# Patient Record
Sex: Female | Born: 1975 | Race: White | Hispanic: No | Marital: Married | Smoking: Never smoker
Health system: Southern US, Community
[De-identification: ages and names within clinical notes are randomized; demographics above are authoritative.]

## PROBLEM LIST (undated history)

## (undated) DIAGNOSIS — F410 Panic disorder [episodic paroxysmal anxiety] without agoraphobia: Secondary | ICD-10-CM

## (undated) HISTORY — PX: APPENDECTOMY: SHX54

## (undated) HISTORY — DX: Panic disorder (episodic paroxysmal anxiety): F41.0

---

## 2004-07-31 ENCOUNTER — Inpatient Hospital Stay: Payer: Self-pay

## 2004-11-09 ENCOUNTER — Ambulatory Visit: Payer: Self-pay | Admitting: Obstetrics and Gynecology

## 2005-04-17 ENCOUNTER — Ambulatory Visit: Payer: Self-pay | Admitting: Family Medicine

## 2006-03-14 ENCOUNTER — Ambulatory Visit: Payer: Self-pay | Admitting: Internal Medicine

## 2006-03-28 ENCOUNTER — Inpatient Hospital Stay: Payer: Self-pay | Admitting: Surgery

## 2007-05-02 IMAGING — CT CT CHEST W/ CM
1 series · 16 of 32 positions shown, 20 images · non-contrast
Comparison: none

REASON FOR EXAM: Abnormal chest xray showed left hilar mass
COMMENTS:

[Series 2: soft tissue · axial · 0.62mm/px · z∈[-342,-42]mm · 16 of 68 slices shown, 20 images]
[im 5/68  soft-tissue]
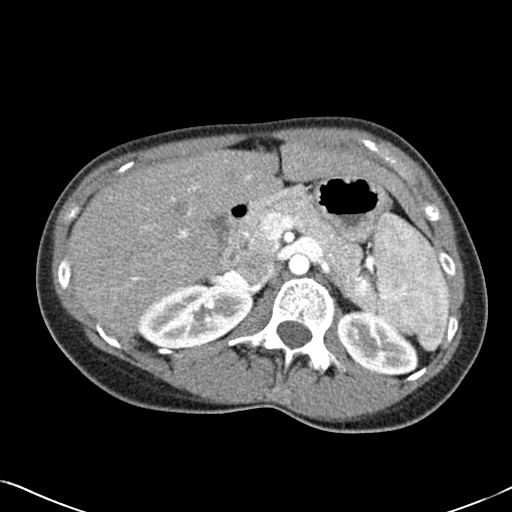
[im 5/68  bone]
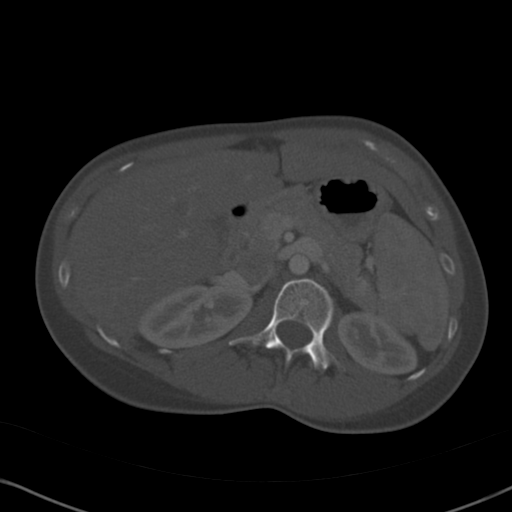
[im 9/68  soft-tissue]
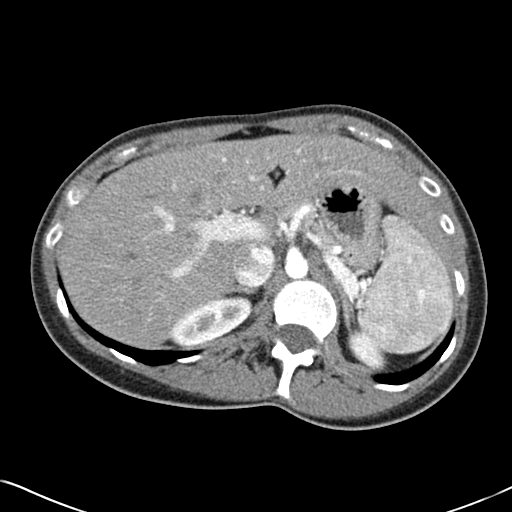
[im 13/68  soft-tissue]
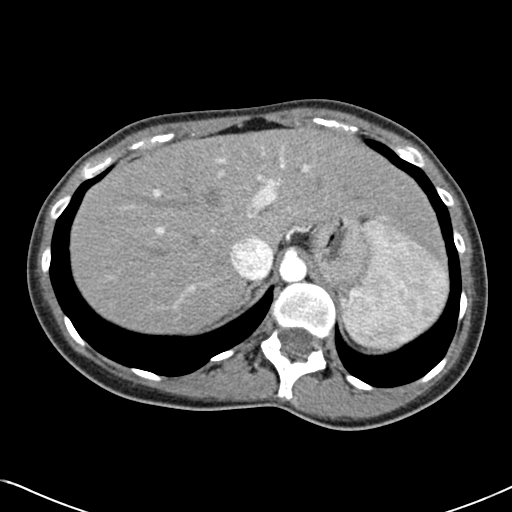
[im 18/68  soft-tissue]
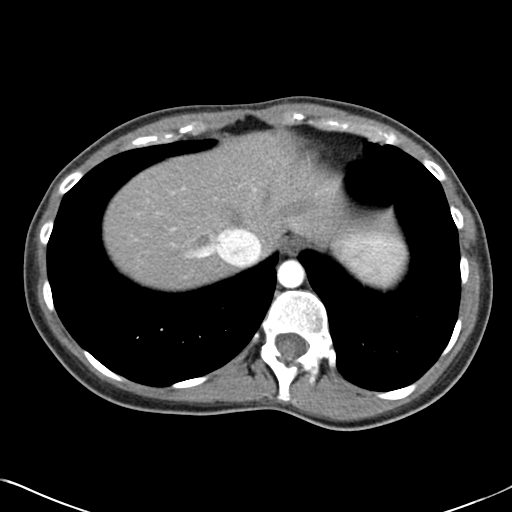
[im 22/68  soft-tissue]
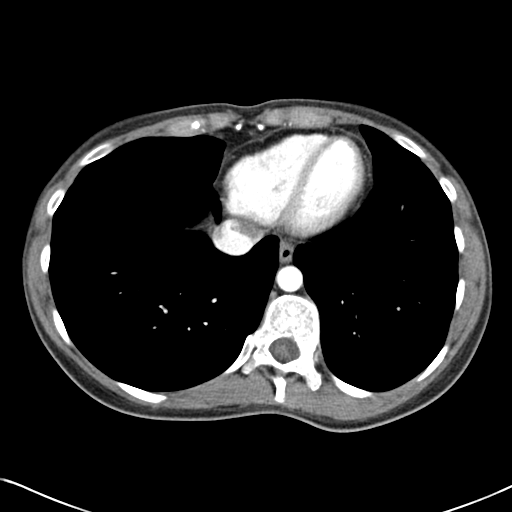
[im 26/68  soft-tissue]
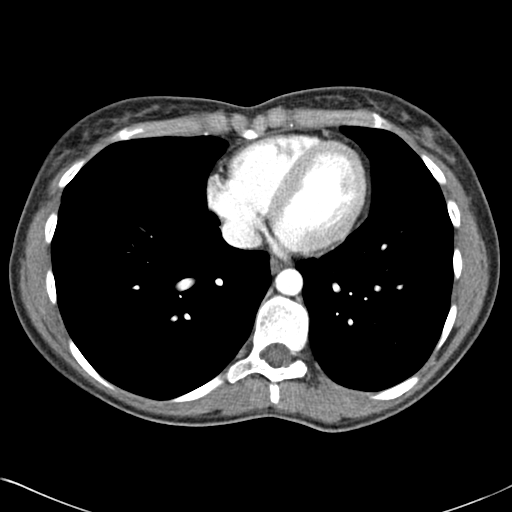
[im 31/68  soft-tissue]
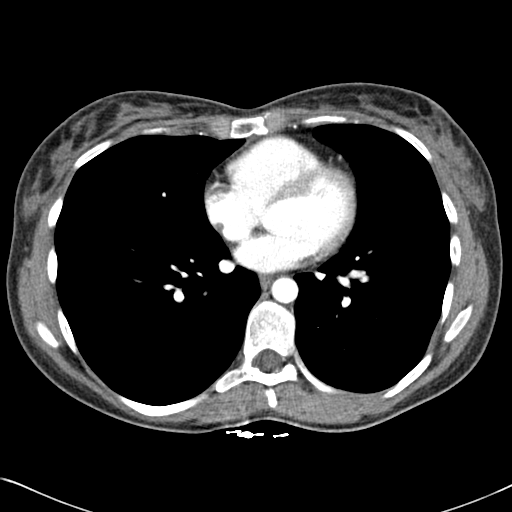
[im 37/68  soft-tissue]
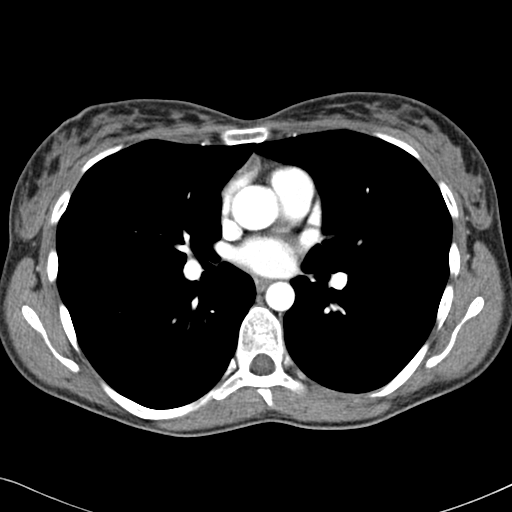
[im 42/68  soft-tissue]
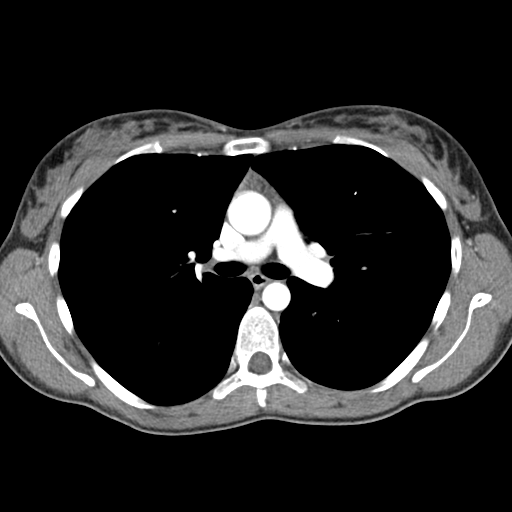
[im 42/68  bone]
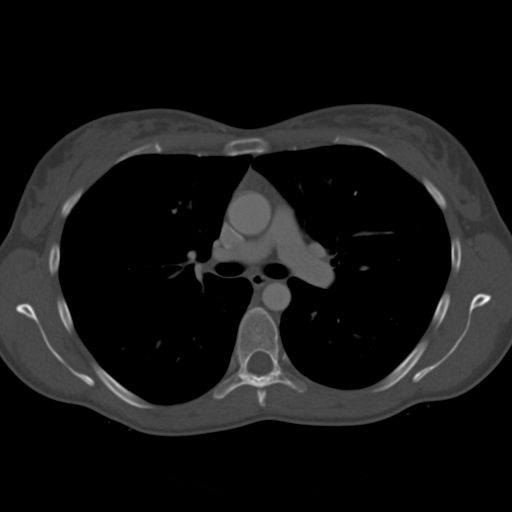
[im 46/68  soft-tissue]
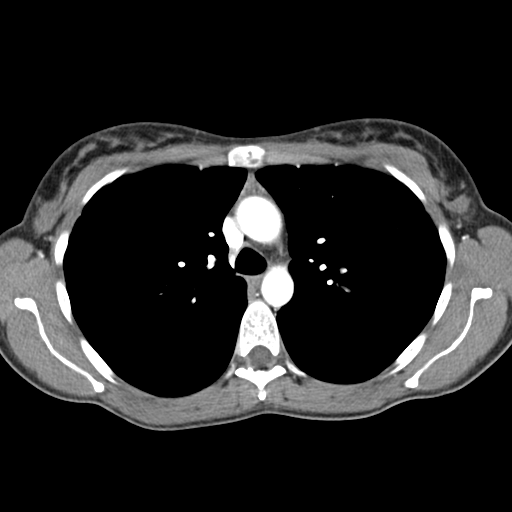
[im 50/68  soft-tissue]
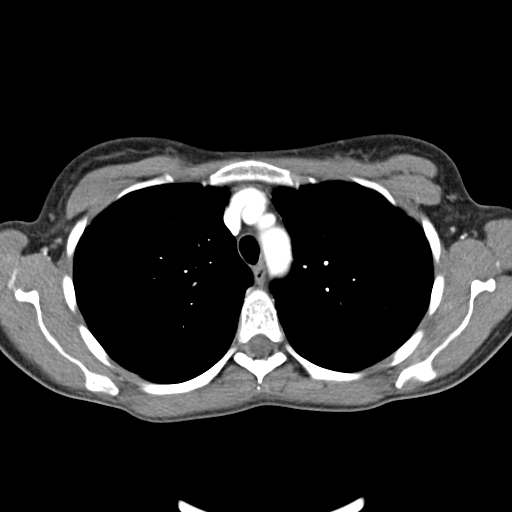
[im 55/68  soft-tissue]
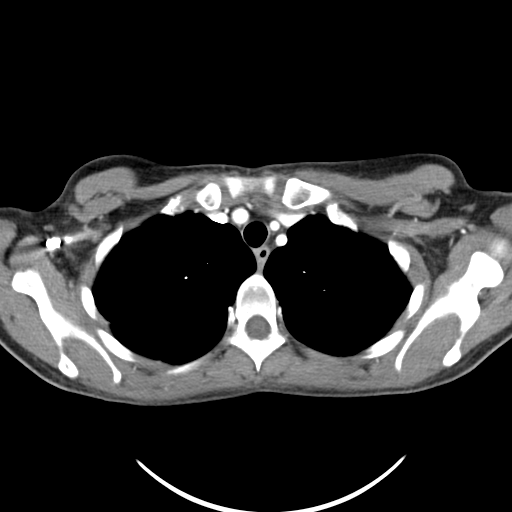
[im 59/68  soft-tissue]
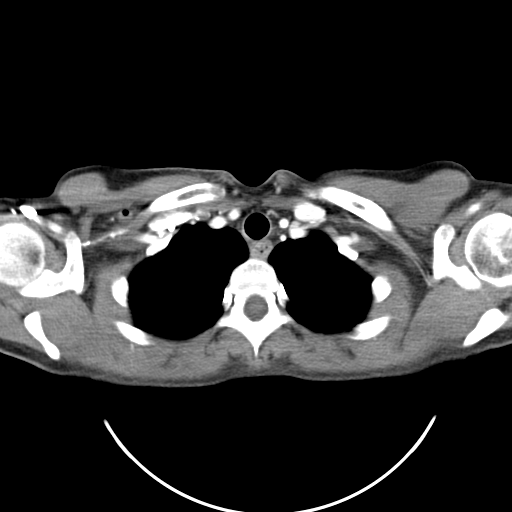
[im 59/68  lung]
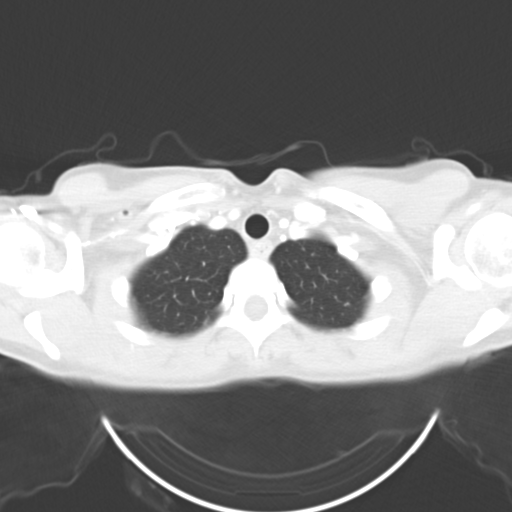
[im 61/68  lung]
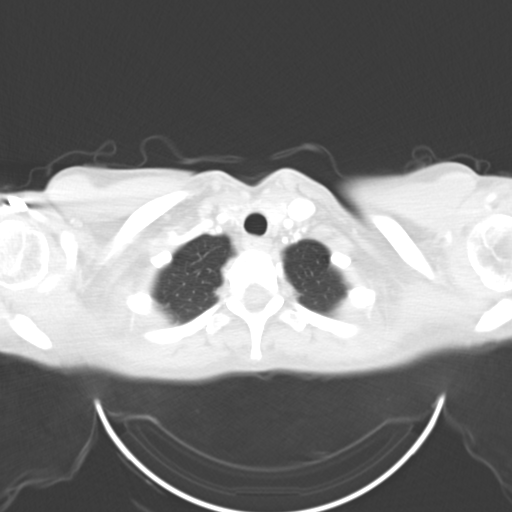
[im 63/68  soft-tissue]
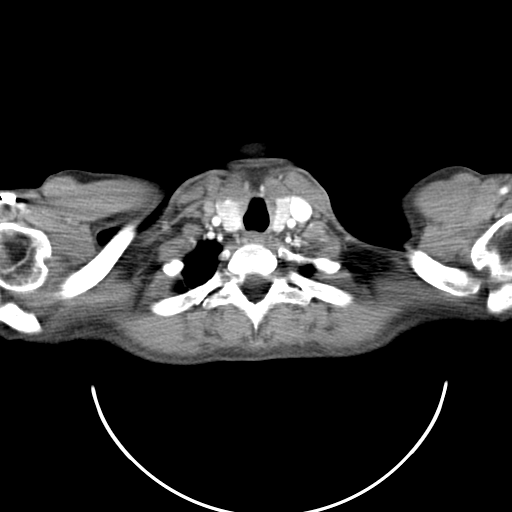
[im 63/68  lung]
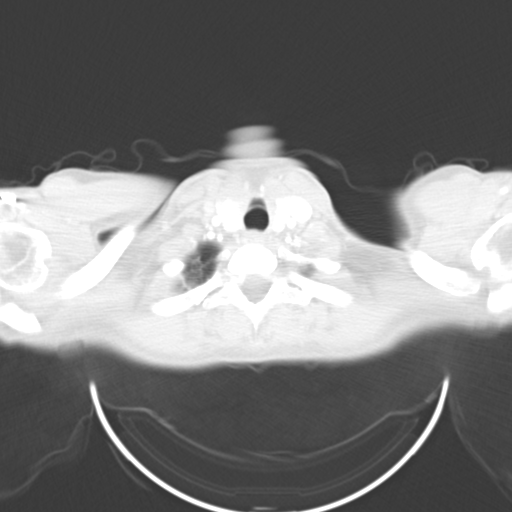
[im 65/68  lung]
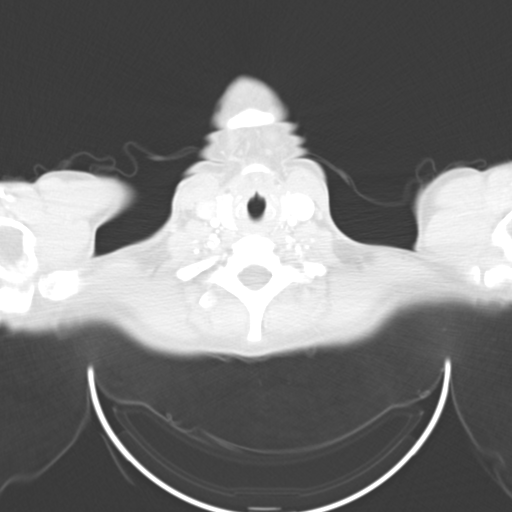

[16 of 32 positions shown; findings below may reference images not displayed]

PROCEDURE:     CT  - CT CHEST WITH CONTRAST  - March 14, 2006  [DATE]

RESULT:          Spiral 5 mm sections were obtained from the thoracic inlet
through the lung bases status post intravenous administration of 75 ml of
[id number].

Evaluation of the mediastinum and hilar regions and structures demonstrates
no evidence of mediastinal nor hilar adenopathy nor masses.  The region of
concern on the previous chest radiograph appears to represent a vascular
structure.  The lung parenchyma demonstrate no evidence of focal
infiltrates, effusions, edema, masses nor nodules.  The visualized upper
abdominal viscera demonstrate no gross abnormalities.
IMPRESSION: 1.     There is no evidence of mediastinal nor hilar adenopathy nor masses.
2.     The lung parenchyma demonstrate no evidence of focal abnormalities.

## 2010-11-08 ENCOUNTER — Ambulatory Visit: Payer: Self-pay | Admitting: Family Medicine

## 2011-12-27 IMAGING — MG MM DIGITAL SCREENING BILAT W/ CAD
1 series · 5 of 5 positions shown · non-contrast
Comparison: none

REASON FOR EXAM: SCR BASELINE
COMMENTS:

[Series 7469: R CC · right · 5 of 5 slices shown]
[im 1/5]
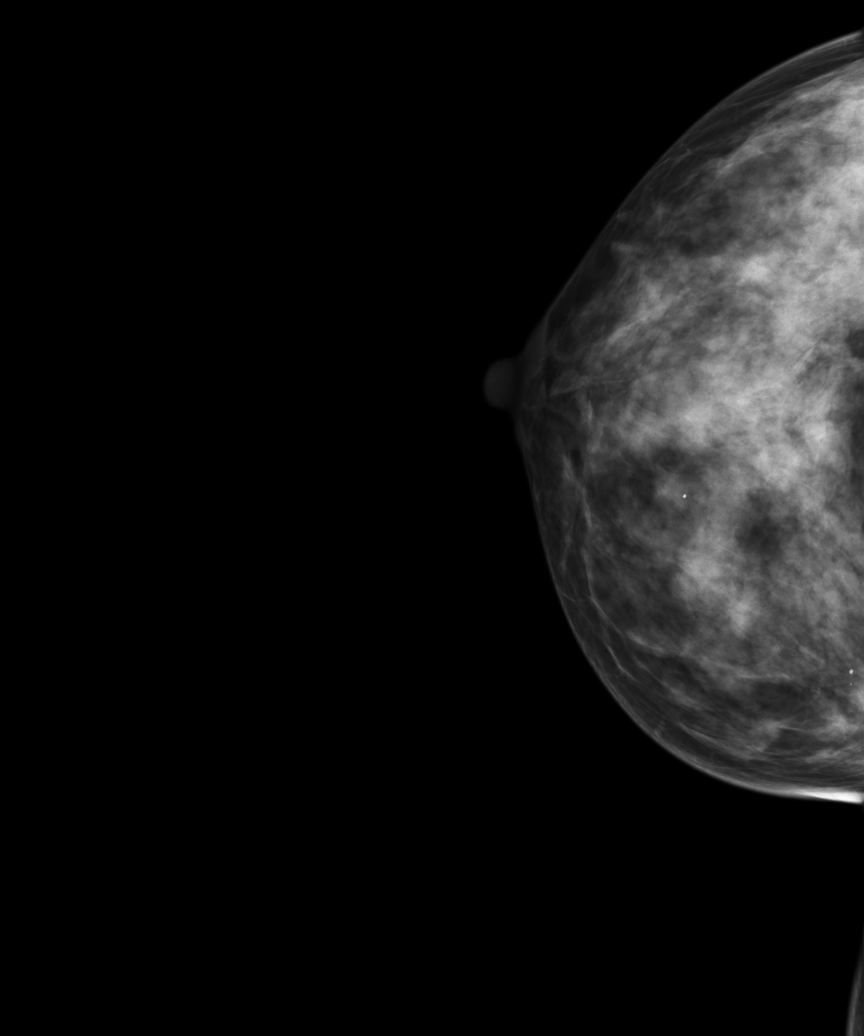
[im 2/5]
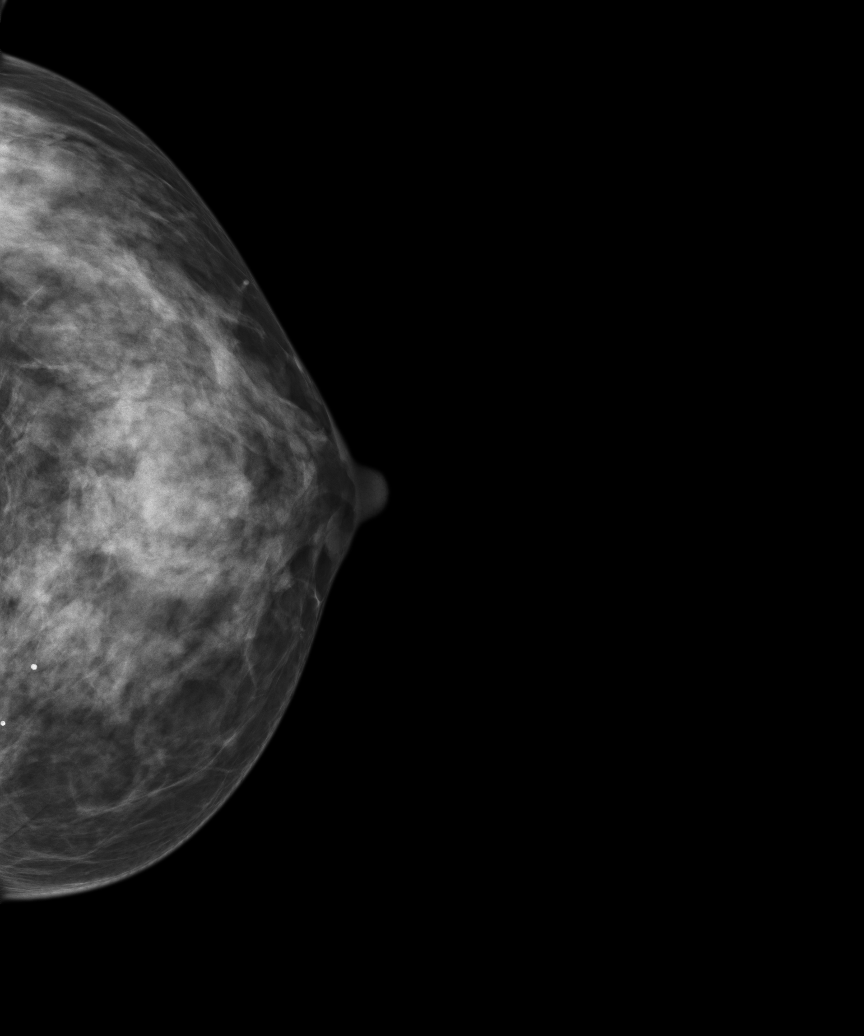
[im 3/5]
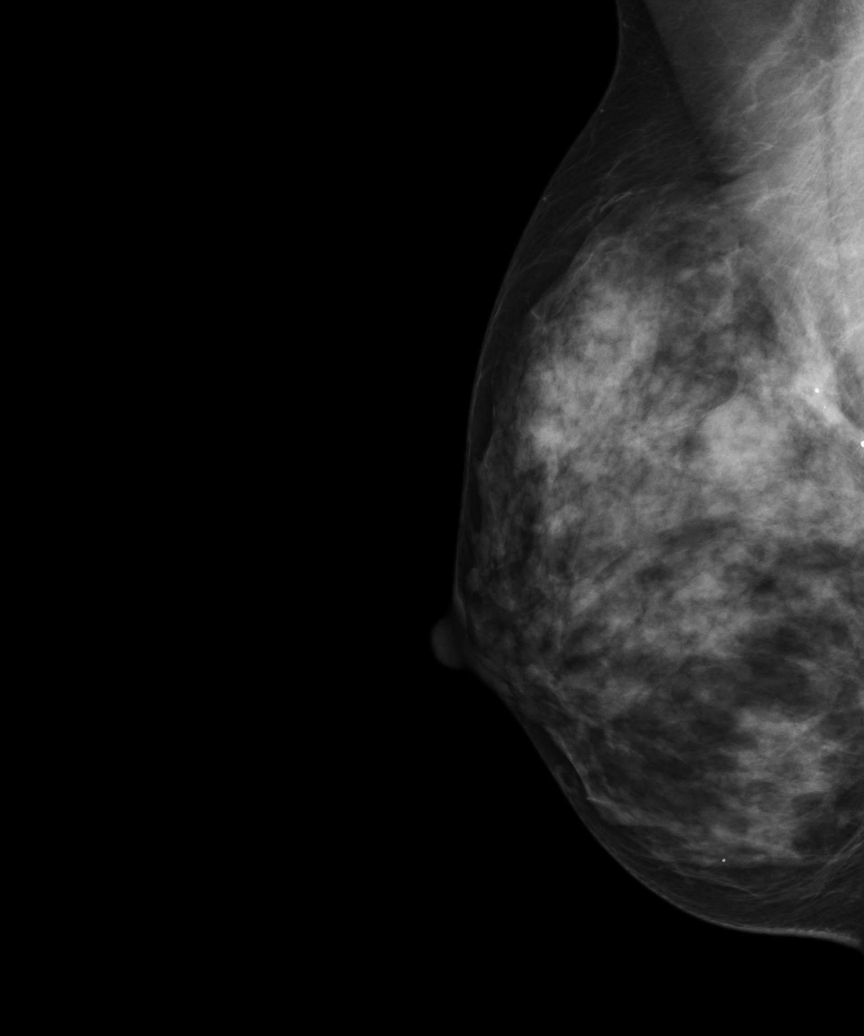
[im 4/5]
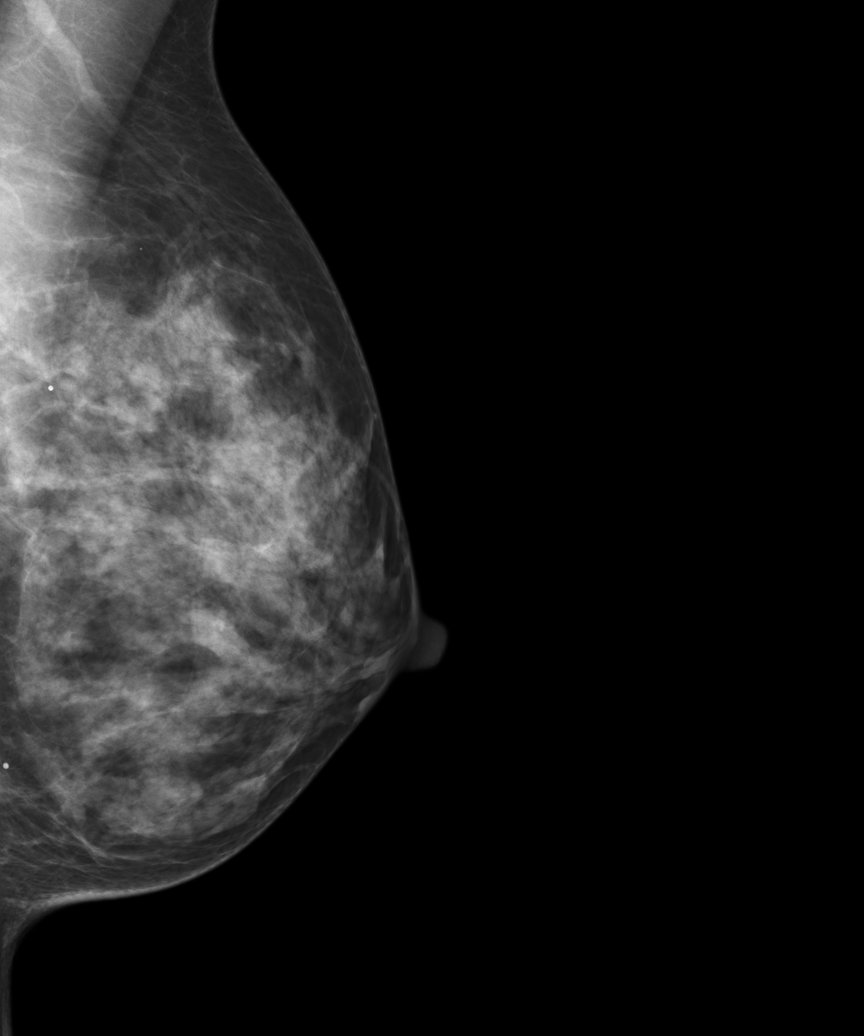
[im 5/5]
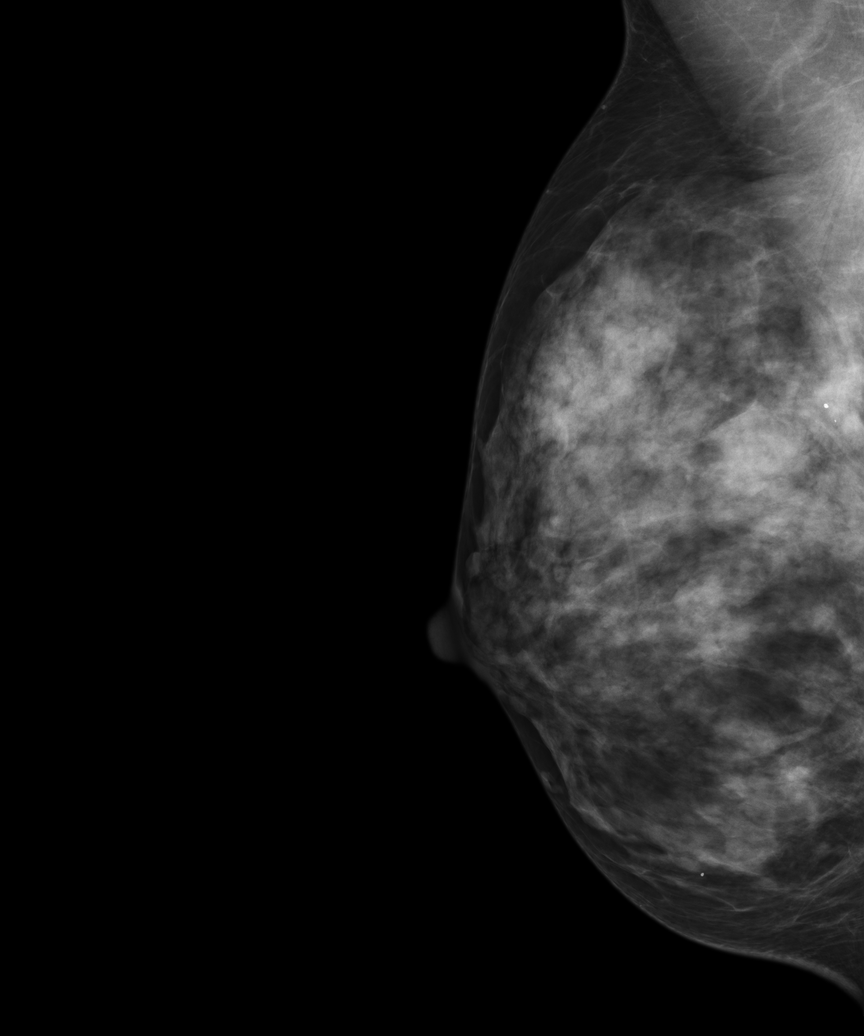

[5 of 5 positions shown; findings below may reference images not displayed]

PROCEDURE:     MMM - MMM DGTL SCREENING MAMMO W/CAD  - November 08, 2010  [DATE]

RESULT:     There are no prior mammograms for comparison. The breast
parenchyma is bilaterally dense which lowers the sensitivity of mammography.
No mass or malignant appearing microcalcifications are seen. A few scattered
benign-appearing calcifications are noted bilaterally.
IMPRESSION: 1. Bilaterally benign appearing baseline mammogram.

BI-RADS: Category 1 - Negative

A NEGATIVE MAMMOGRAM REPORT DOES NOT PRECLUDE BIOPSY OR OTHER EVALUATION OF
A CLINICALLY PALPABLE OR OTHERWISE SUSPICIOUS MASS OR LESION. BREAST CANCER
MAY NOT BE DETECTED BY MAMMOGRAPHY IN UP TO 10% OF CASES.

## 2013-08-24 ENCOUNTER — Ambulatory Visit: Payer: Self-pay | Admitting: Obstetrics and Gynecology

## 2015-10-05 ENCOUNTER — Other Ambulatory Visit: Payer: Self-pay | Admitting: Obstetrics and Gynecology

## 2015-10-05 DIAGNOSIS — Z1231 Encounter for screening mammogram for malignant neoplasm of breast: Secondary | ICD-10-CM

## 2015-10-06 ENCOUNTER — Ambulatory Visit
Admission: RE | Admit: 2015-10-06 | Discharge: 2015-10-06 | Disposition: A | Discharge status: Home / Self Care | Payer: 59 | Source: Ambulatory Visit | Attending: Obstetrics and Gynecology | Admitting: Obstetrics and Gynecology

## 2015-10-06 DIAGNOSIS — Z1231 Encounter for screening mammogram for malignant neoplasm of breast: Secondary | ICD-10-CM | POA: Diagnosis not present

## 2015-11-08 LAB — HM MAMMOGRAPHY: HM MAMMO: NORMAL (ref 0–4)

## 2016-10-09 ENCOUNTER — Other Ambulatory Visit: Payer: Self-pay | Admitting: Family Medicine

## 2016-10-09 DIAGNOSIS — Z1231 Encounter for screening mammogram for malignant neoplasm of breast: Secondary | ICD-10-CM

## 2016-10-31 ENCOUNTER — Ambulatory Visit
Admission: RE | Admit: 2016-10-31 | Discharge: 2016-10-31 | Disposition: A | Discharge status: Home / Self Care | Payer: 59 | Source: Ambulatory Visit | Attending: Family Medicine | Admitting: Family Medicine

## 2016-10-31 DIAGNOSIS — Z1231 Encounter for screening mammogram for malignant neoplasm of breast: Secondary | ICD-10-CM | POA: Diagnosis not present

## 2016-11-18 ENCOUNTER — Encounter: Payer: Self-pay | Admitting: *Deleted

## 2016-11-18 ENCOUNTER — Encounter: Payer: Self-pay | Admitting: Family Medicine

## 2016-11-18 DIAGNOSIS — F410 Panic disorder [episodic paroxysmal anxiety] without agoraphobia: Secondary | ICD-10-CM

## 2016-11-18 DIAGNOSIS — F064 Anxiety disorder due to known physiological condition: Secondary | ICD-10-CM | POA: Insufficient documentation

## 2016-11-19 ENCOUNTER — Ambulatory Visit (INDEPENDENT_AMBULATORY_CARE_PROVIDER_SITE_OTHER): Payer: 59 | Admitting: Family Medicine

## 2016-11-19 ENCOUNTER — Encounter: Payer: Self-pay | Admitting: Family Medicine

## 2016-11-19 VITALS — BP 106/75 | HR 70 | Temp 98.4°F | Resp 16 | Ht 61.0 in | Wt 131.0 lb

## 2016-11-19 DIAGNOSIS — J0100 Acute maxillary sinusitis, unspecified: Secondary | ICD-10-CM

## 2016-11-19 MED ORDER — PSEUDOEPHEDRINE-GUAIFENESIN ER 60-600 MG PO TB12: 1.0000 | ORAL_TABLET | Freq: Two times a day (BID) | ORAL | 0 refills | Status: DC

## 2016-11-19 MED ORDER — AMOXICILLIN-POT CLAVULANATE 875-125 MG PO TABS: 1.0000 | ORAL_TABLET | Freq: Two times a day (BID) | ORAL | 0 refills | Status: DC

## 2016-11-19 NOTE — Progress Notes (Signed)
  Name: Sheila Clarke   MRN: [id number]    DOB: 10-01-1976   Date:11/19/2016       Progress Note  Subjective  Chief Complaint  Chief Complaint  Patient presents with  . Sinusitis    HPI Here c/o sinus pain and pressure.  Started with nasal congestion and sneezing.  Has had thick yellow mucus and bloody mucus. Maxicillary pressure.  No cough.  No fever.  Takes Benadryl at night.  Takes Celexa for chronic anxiety disorder.  No problem-specific Assessment & Plan notes found for this encounter.   Past Medical History:  Diagnosis Date  . Panic attacks     Social History  Substance Use Topics  . Smoking status: Never Smoker  . Smokeless tobacco: Never Used  . Alcohol use No     Current Outpatient Prescriptions:  .  citalopram (CELEXA) 20 MG tablet, Take 20 mg by mouth daily., Disp: , Rfl:   Not on File  Review of Systems  Constitutional: Negative for chills, fever, malaise/fatigue and weight loss.  HENT: Positive for congestion and sinus pain. Negative for hearing loss, sore throat and tinnitus.   Eyes: Negative for blurred vision and double vision.  Respiratory: Negative for cough, sputum production and shortness of breath.   Cardiovascular: Negative for chest pain, palpitations and leg swelling.  Gastrointestinal: Negative for abdominal pain, blood in stool and heartburn.  Genitourinary: Negative for dysuria, frequency and urgency.  Musculoskeletal: Negative for back pain and myalgias.  Skin: Negative for rash.  Neurological: Negative for dizziness, tingling, tremors, weakness and headaches.  Psychiatric/Behavioral: Negative for depression. The patient is not nervous/anxious and does not have insomnia.       Objective  Vitals:   11/19/16 1110  BP: 106/75  Pulse: 70  Resp: 16  Temp: 98.4 F (36.9 C)  TempSrc: Oral  SpO2: 100%  Weight: 131 lb (59.4 kg)  Height: 5' 1"  (1.549 m)     Physical Exam  Constitutional: She is oriented to person, place, and  time and well-developed, well-nourished, and in no distress. No distress.  HENT:  Head: Normocephalic and atraumatic.  Right Ear: External ear normal.  Left Ear: External ear normal.  Nose: Mucosal edema and rhinorrhea present. Right sinus exhibits maxillary sinus tenderness. Left sinus exhibits maxillary sinus tenderness.  Neck: Normal range of motion. Neck supple. No thyromegaly present.  Cardiovascular: Normal rate, regular rhythm and normal heart sounds.   Pulmonary/Chest: No respiratory distress. She has no wheezes. She has no rales.  Lymphadenopathy:    She has no cervical adenopathy.  Neurological: She is alert and oriented to person, place, and time.  Vitals reviewed.     No results found for this or any previous visit (from the past 2160 hour(s)).   Assessment & Plan  1. Acute non-recurrent maxillary sinusitis  - amoxicillin-clavulanate (AUGMENTIN) 875-125 MG tablet; Take 1 tablet by mouth 2 (two) times daily.  Dispense: 20 tablet; Refill: 0 - pseudoephedrine-guaifenesin (MUCINEX D) 60-600 MG 12 hr tablet; Take 1 tablet by mouth every 12 (twelve) hours.  Dispense: 20 tablet; Refill: 0

## 2016-12-25 ENCOUNTER — Encounter: Payer: Self-pay | Admitting: Family Medicine

## 2016-12-25 ENCOUNTER — Ambulatory Visit (INDEPENDENT_AMBULATORY_CARE_PROVIDER_SITE_OTHER): Payer: 59 | Admitting: Family Medicine

## 2016-12-25 VITALS — BP 105/73 | HR 68 | Temp 97.4°F | Resp 16 | Ht 61.0 in | Wt 131.0 lb

## 2016-12-25 DIAGNOSIS — Z85828 Personal history of other malignant neoplasm of skin: Secondary | ICD-10-CM | POA: Diagnosis not present

## 2016-12-25 DIAGNOSIS — F064 Anxiety disorder due to known physiological condition: Secondary | ICD-10-CM | POA: Diagnosis not present

## 2016-12-25 DIAGNOSIS — F410 Panic disorder [episodic paroxysmal anxiety] without agoraphobia: Secondary | ICD-10-CM

## 2016-12-25 DIAGNOSIS — L989 Disorder of the skin and subcutaneous tissue, unspecified: Secondary | ICD-10-CM | POA: Diagnosis not present

## 2016-12-25 DIAGNOSIS — Z7689 Persons encountering health services in other specified circumstances: Secondary | ICD-10-CM | POA: Diagnosis not present

## 2016-12-25 MED ORDER — CITALOPRAM HYDROBROMIDE 20 MG PO TABS: 10.0000 mg | ORAL_TABLET | Freq: Every day | ORAL | 11 refills | Status: DC

## 2016-12-25 NOTE — Progress Notes (Signed)
Subjective:    Patient ID: Sheila Clarke, female    DOB: 28-May-1976, 41 y.o.   MRN: [id number]  Sheila Clarke is a 41 y.o. female presenting on 12/25/2016 for Establish Care (pt is concerned about spot in her shoulder could be skin cancer )  Previously had been followed by PCP at [place] (previously with Dr Gaylan Gerold, then he retired, and switched PCP), now lives closer to [place], establishing here.  HPI   Left Shoulder, Abnormal Skin Lesion - Reports new finding of abnormal skin lesion on back of Left shoulder, had spot that she had scratched about 2-3 weeks ago, initially had some scabbing. Some symptoms of itch initially. Not painful. No redness. - Prior history of abnormal skin lesions, removed one on and other under her R breast, diagnosed with skin cancer, unsure exact type, thinks it may be Basal Cell Carcinoma - History of prior increased sun exposure, and sun burns previously would have sunburn easily in past. Now more cautious.  Anxiety, Chronic with Panic Attacks: - Chronic problem since about age 76, with anxiety diagnosis, has been on variety of medications in past had been on Klonopin, and possibly Wellbutrin. Thought that she did not do as well on other medication felt "foggy" on klonopin, in past she had come off of it during pregnancy in past, without good results. - Currently taking Celexa 44m tabs - usually takes 18mdaily (half tab), for past 10 years, doing well on this dose, only occasionally needs to take whole tab 2057mor few days at a time with inc stress such as holidays - Describes her anxiety, is well controlled. But does have history of panic attacks, often trigger is related to being overwhelmed or stress. Has had panic since young age, symptoms with brief spell of palpitations and rapid heart beat (without chest pain, pressure, dyspnea, sweating), often self limited. She is more aware now how to cope with and handle panic attacks, and  often improved with deep breathing. Now infrequent flares. - Prior history of seeing Psychiatry vs Psychology / had some therapy as child but nothing that has been continued - Denies any depression, sadness, insomnia  Recent Sinusitis - resolved on Augmentin  [facility] 12/25[facility]2016 11/19/2016  Decreased Interest 0 0  Down, Depressed, Hopeless 0 0  PHQ - 2 Score 0 0   GAD 7 : Generalized Anxiety Score 12/25/2016  Nervous, Anxious, on Edge 0  Control/stop worrying 0  Worry too much - different things 0  Trouble relaxing 0  Restless 0  Easily annoyed or irritable 0  Afraid - awful might happen 0  Total GAD 7 Score 0  Anxiety Difficulty Not difficult at all   Past Medical History:  Diagnosis Date  . Panic attacks    Past Surgical History:  Procedure Laterality Date  . APPENDECTOMY     Social History   Social History  . Marital status: Married    Spouse name: N/A  . Number of children: N/A  . Years of education: N/A   Occupational History  . Not on file.   Social History Main Topics  . Smoking status: Never Smoker  . Smokeless tobacco: Never Used  . Alcohol use No  . Drug use: No  . Sexual activity: Yes   Other Topics Concern  . Not on file   Social History Narrative  . No narrative on file   Family History  Problem Relation Age of Onset  . Congestive  Heart Failure Mother   . Asthma Mother   . Cancer Father   . Heart disease Father   . Skin cancer Father   . Breast cancer Neg Hx    No current outpatient prescriptions on file prior to visit.   No current facility-administered medications on file prior to visit.     Review of Systems  Constitutional: Negative for activity change, appetite change, chills, diaphoresis, fatigue, fever and unexpected weight change.  HENT: Negative for congestion, hearing loss and sinus pressure.   Eyes: Negative for visual disturbance.  Respiratory: Negative for cough, chest tightness, shortness of breath and  wheezing.   Cardiovascular: Negative for chest pain, palpitations and leg swelling.  Gastrointestinal: Negative for abdominal pain, constipation, diarrhea, nausea and vomiting.  Endocrine: Negative for cold intolerance and polyuria.  Genitourinary: Negative for dysuria, frequency and hematuria.  Musculoskeletal: Negative for arthralgias and neck pain.  Skin: Negative for rash.  Allergic/Immunologic: Negative for environmental allergies.  Neurological: Negative for dizziness, weakness, light-headedness, numbness and headaches.  Hematological: Negative for adenopathy.  Psychiatric/Behavioral: Negative for behavioral problems, decreased concentration, dysphoric mood and sleep disturbance. The patient is nervous/anxious (occasional panic, but otherwise controlled).    Per HPI unless specifically indicated above     Objective:    BP 105/73   Pulse 68   Temp 97.4 F (36.3 C) (Oral)   Resp 16   Ht 5' 1"  (1.549 m)   Wt 131 lb (59.4 kg)   BMI 24.75 kg/m   Wt Readings from Last 3 Encounters:  12/25/16 131 lb (59.4 kg)  11/19/16 131 lb (59.4 kg)    Physical Exam  Constitutional: She appears well-developed and well-nourished. No distress.  Well-appearing, comfortable, cooperative  HENT:  Head: Normocephalic and atraumatic.  Mouth/Throat: Oropharynx is clear and moist.  Eyes: Conjunctivae are normal.  Neck: Normal range of motion. Neck supple.  Cardiovascular: Normal rate.   Pulmonary/Chest: Effort normal.  Musculoskeletal: She exhibits no edema.  Neurological: She is alert.  Skin: Skin is warm and dry. No rash noted. She is not diaphoretic. No erythema.  Left posterior shoulder < 1 cm abnormal skin lesion with rough palpable area, without redness, some darker pigmentation compared to surrounding freckled skin, but not entirely consistent with defined nevus. No ulceration or bleeding. Non tender. No surrounding abnormality  Psychiatric: She has a normal mood and affect. Her behavior  is normal.  Well groomed, good eye contact, normal speech and thoughts  Nursing note and vitals reviewed.  Left Shoulder Posterior       Assessment & Plan:   Problem List Items Addressed This Visit    Skin lesion of back    Concern with abnormal distinct rough non healing patch on left shoulder for few weeks now, minimal symptoms, does seem slightly discolored or demarcated compared to surrounding skin. Otherwise not entirely consistent with a nevus, difficult with her base skin with freckles - Personal history of skin cancer reported (not melanoma) Family history of skin cancer father.  Plan: 1. Referral to [facility] Dermatology for evaluation and management of abnormal lesion - patient preference, she had previously been seen at [facility] and would like to try alternative Dermatology, may benefit from future routine skin checks for monitoring      Relevant Orders   Ambulatory referral to Dermatology   History of skin cancer    Reported 2 abnormal skin lesions, biopsy/removed from R breast / trunk - diagnosed with skin cancer, unsure what type (approx  2013) - does not think it was melanoma - Concern now with abnormal skin lesion, and risk factors of fair skin with prior high sun exposure - Referral to Dermatology      Relevant Orders   Ambulatory referral to Dermatology   Anxiety disorder due to general medical condition with panic attack    Stable, chronic anxiety >20 years with infrequent panic attacks, without other complications. Not consistent with GAD, or associated mood disorder. Well functioning. -GAD7: 0 / PHQ9: 0 Controlled on Celexa Med history: prior on Klonopin, Wellbutrin Not followed by Psychiatry / Psychology  Plan: 1. Continue current med - Celexa 58m tabs (refilled today) - taking half tab 162mdaily most days, rarely will use whole tab 2093mor few days to < 1 week at a time if flare or stressors - seems to work for patient, reviewed  likely needs longer than 1-2 weeks to have full effect of higher dose, but she is improved on short course only 2. Continue self management of rare panic attacks - breathing technique, no additional treatment needed 3. Consider psychology / therapy in future if needed 4. Follow-up 6 months for Annual Physical, GAD7/PHQ9       Relevant Medications   citalopram (CELEXA) 20 MG tablet    Other Visit Diagnoses    Encounter to establish care with new doctor    -  Primary      Meds ordered this encounter  Medications  . citalopram (CELEXA) 20 MG tablet    Sig: Take 0.5-1 tablets (10-20 mg total) by mouth daily.    Dispense:  30 tablet    Refill:  11      Follow up plan: Return in about 6 months (around 06/27/2017) for Annual Physical.  AleNobie PutnamO [place] 12/25/2016, 2:45 PM

## 2016-12-25 NOTE — Assessment & Plan Note (Signed)
Stable, chronic anxiety >20 years with infrequent panic attacks, without other complications. Not consistent with GAD, or associated mood disorder. Well functioning. -GAD7: 0 / PHQ9: 0 Controlled on Celexa Med history: prior on Klonopin, Wellbutrin Not followed by Psychiatry / Psychology  Plan: 1. Continue current med - Celexa 31m tabs (refilled today) - taking half tab 132mdaily most days, rarely will use whole tab 2062mor few days to < 1 week at a time if flare or stressors - seems to work for patient, reviewed likely needs longer than 1-2 weeks to have full effect of higher dose, but she is improved on short course only 2. Continue self management of rare panic attacks - breathing technique, no additional treatment needed 3. Consider psychology / therapy in future if needed 4. Follow-up 6 months for Annual Physical, GAD7/PHQ9

## 2016-12-25 NOTE — Assessment & Plan Note (Addendum)
Reported 2 abnormal skin lesions, biopsy/removed from R breast / trunk - diagnosed with skin cancer, unsure what type (approx 2013) - does not think it was melanoma - Concern now with abnormal skin lesion, and risk factors of fair skin with prior high sun exposure - Referral to Dermatology

## 2016-12-25 NOTE — Patient Instructions (Signed)
Thank you for coming in to clinic today.  For anxiety / panic - Continue Celexa as you are - refilled your rx, continue half tab 108m daily most days if you need to increase to 217mthis is no problem  Referral sent to Dermatology - stay tuned for appointment, if unable to schedule it within one month, then can contact our office and we can re-schedule for [facility] [address] [id number]: 8AM-5PM Phone: [phone] SerMD TaBrendolyn PattyMD  ----- Complete your biometric screening for work, and bring usKoreahe results   Check with insurance on Lab Coverage >> [place] [facility] need [facility] do [facility])   You will be due for FAAmity[place]eno food or drink after midnight before, only water or coffee without cream/sugar on the morning of)  - Please go ahead and schedule a "Lab Only" visit in the morning at the clinic for lab draw in 6 months (after 06/14/17) before next Annual Physical - Make sure Lab Only appointment is at least 1-2 weeks before your next appointment, so that results will be available  For Lab Results, once available within 2-3 days of blood draw, you can can log in to MyChart online to view your results and a brief explanation. Also, we can discuss results at next follow-up visit.   Please schedule a follow-up appointment with Dr. KaParks Rangern 6 months for Annual Physical  If you have any other questions or concerns, please feel free to call the clinic or send a message through MyGreeleyYou may also schedule an earlier appointment if necessary.  AlNobie PutnamDO [place]

## 2016-12-25 NOTE — Assessment & Plan Note (Addendum)
Concern with abnormal distinct rough non healing patch on left shoulder for few weeks now, minimal symptoms, does seem slightly discolored or demarcated compared to surrounding skin. Otherwise not entirely consistent with a nevus, difficult with her base skin with freckles - Personal history of skin cancer reported (not melanoma) Family history of skin cancer father.  Plan: 1. Referral to [facility] Dermatology for evaluation and management of abnormal lesion - patient preference, she had previously been seen at [facility] and would like to try alternative Dermatology, may benefit from future routine skin checks for monitoring

## 2017-01-21 DIAGNOSIS — Z124 Encounter for screening for malignant neoplasm of cervix: Secondary | ICD-10-CM | POA: Diagnosis not present

## 2017-01-21 DIAGNOSIS — Z1211 Encounter for screening for malignant neoplasm of colon: Secondary | ICD-10-CM | POA: Diagnosis not present

## 2017-01-23 LAB — HM PAP SMEAR: HM PAP: NEGATIVE

## 2017-07-18 DIAGNOSIS — Z23 Encounter for immunization: Secondary | ICD-10-CM | POA: Diagnosis not present

## 2017-08-13 ENCOUNTER — Telehealth: Payer: Self-pay | Admitting: Family Medicine

## 2017-08-13 ENCOUNTER — Encounter: Payer: Self-pay | Admitting: Family Medicine

## 2017-08-13 ENCOUNTER — Ambulatory Visit (INDEPENDENT_AMBULATORY_CARE_PROVIDER_SITE_OTHER): Payer: 59 | Admitting: Family Medicine

## 2017-08-13 ENCOUNTER — Other Ambulatory Visit: Payer: Self-pay | Admitting: Family Medicine

## 2017-08-13 VITALS — BP 106/74 | HR 79 | Temp 98.3°F | Resp 16 | Ht 61.0 in | Wt 129.0 lb

## 2017-08-13 DIAGNOSIS — R799 Abnormal finding of blood chemistry, unspecified: Secondary | ICD-10-CM

## 2017-08-13 DIAGNOSIS — M79671 Pain in right foot: Secondary | ICD-10-CM

## 2017-08-13 DIAGNOSIS — Z862 Personal history of diseases of the blood and blood-forming organs and certain disorders involving the immune mechanism: Secondary | ICD-10-CM

## 2017-08-13 DIAGNOSIS — M150 Primary generalized (osteo)arthritis: Secondary | ICD-10-CM

## 2017-08-13 DIAGNOSIS — F064 Anxiety disorder due to known physiological condition: Secondary | ICD-10-CM

## 2017-08-13 DIAGNOSIS — M159 Polyosteoarthritis, unspecified: Secondary | ICD-10-CM | POA: Insufficient documentation

## 2017-08-13 DIAGNOSIS — M79642 Pain in left hand: Secondary | ICD-10-CM

## 2017-08-13 DIAGNOSIS — M79641 Pain in right hand: Secondary | ICD-10-CM

## 2017-08-13 DIAGNOSIS — Z0000 Encounter for general adult medical examination without abnormal findings: Secondary | ICD-10-CM

## 2017-08-13 DIAGNOSIS — Q667 Congenital pes cavus, unspecified foot: Secondary | ICD-10-CM

## 2017-08-13 DIAGNOSIS — F410 Panic disorder [episodic paroxysmal anxiety] without agoraphobia: Secondary | ICD-10-CM

## 2017-08-13 MED ORDER — NAPROXEN 500 MG PO TABS: 500.0000 mg | ORAL_TABLET | Freq: Two times a day (BID) | ORAL | 1 refills | Status: DC

## 2017-08-13 NOTE — Telephone Encounter (Signed)
Pt decided she did want referral to podiatrist.

## 2017-08-13 NOTE — Assessment & Plan Note (Addendum)
Clinically consistent with MSK R mid dorsal foot pain and swelling with overuse injury and underlying pes cavus with possible early bunion, increasing susceptibility to problem. Gradual worse x 3-4 weeks now - Able to weight bear, only mild limp if aggravated at end of day - Improved with conservative therapy, currently inadequate dose, limited rest, prolonged work standing  Plan: 1. Start Naproxen 552m BID wc 2-4 weeks then PRN 2. Tylenol PRN breakthrough 3. RICE therapy, Ice for swelling and elevation also ACE wrap, and heat PRN pain 4. Handout given foot exercises 5. Advised try to avoid excess time on feet, offered work note, defer for now - Try OTC insoles for arch support 6. Considered X-rays decide to defer today since no traumatic injury and pain duration only 3 weeks, not longer than 4-6 7. Offered referral to Podiatry for future custom insoles and supports possible injection - defer for now, only if worsening 8. Follow-up within 4-6 weeks if not improved - consider topical diclofenac, x-ray, refer to podiatry  *UPDATE - patient called back this afternoon and requested referral to Podiatrist, [facility]

## 2017-08-13 NOTE — Progress Notes (Signed)
Subjective:    Patient ID: Sheila Clarke, female    DOB: 06/12/1976, 41 y.o.   MRN: [id number]  Sheila Clarke is a 41 y.o. female presenting on 08/13/2017 for Foot Pain (right side onset 3 weeks swollen )   HPI   RIGHT MID-FOOT PAIN and Swelling, Acute-Subacute Reports new problem onset 3 weeks ago on (October 17) after waking up on that day felt R foot was "not quite right" and thought she had slept "odd" and stated she felt a "cramp" in foot mid arch and gradually worsening with increased activity. Seems symptoms were improved in morning and then gradual worsening during day if active and on it, states it has caused her to limp during late afternoon and early evening, and worse pain at night, sometimes will keep her awake due to soreness. - Admits some swelling worse in evening, some improve in AM - Tried ice packs, heating pad. 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 - Denies any prior foot ankle injury, and no current fall trauma or injury  Health Maintenance: - UTD Flu Shot 07/21/17  [facility] 12/25/2016 11/19/2016  Decreased Interest 0 0  Down, Depressed, Hopeless 0 0  PHQ - 2 Score 0 0    Social History   Tobacco Use  . Smoking status: Never Smoker  . Smokeless tobacco: Never Used  Substance Use Topics  . Alcohol use: No  . Drug use: No    Review of Systems Per HPI unless specifically indicated above     Objective:    BP 106/74 (BP Location: Left Arm, Patient Position: Sitting, Cuff Size: Normal)   Pulse 79   Temp 98.3 F (36.8 C)  (Oral)   Resp 16   Ht 5' 1"  (1.549 m)   Wt 129 lb (58.5 kg)   BMI 24.37 kg/m   Wt Readings from Last 3 Encounters:  08/13/17 129 lb (58.5 kg)  12/25/16 131 lb (59.4 kg)  11/19/16 131 lb (59.4 kg)    Physical Exam  Constitutional: She is oriented to person, place, and time. She appears well-developed and well-nourished. No distress.  Well-appearing, comfortable, cooperative  HENT:  Head: Normocephalic and atraumatic.  Mouth/Throat: Oropharynx is clear and moist.  Eyes: Conjunctivae are normal. Right eye exhibits no discharge. Left eye exhibits no discharge.  Cardiovascular: Normal rate.  Pulmonary/Chest: Effort normal.  Musculoskeletal: She exhibits no edema.  Bilateral Feet Inspection: appearance non weightbearing and weightbearing standing shows significant pes cavus high mid foot medial arch, and some lateral deviation of great toes with early bunion formation. R mid foot with dorsal mild localized soft tissue edema and flushed appearance without erythema Palpation: Mild to moderate tenderness to palpation over R mid foot dorsal only. Forefoot and hindfoot non tender, non tender plantar fascia medial heel. Some mild discomfort over medial ligaments and anteriorly. ROM: Full active ROM flex/ext Strength: distal intact Neurovascular: distal intact  Neurological: She is alert and oriented to person, place, and time.  Skin: Skin is warm and dry. No rash noted. She is not  diaphoretic. No erythema.  Psychiatric: She has a normal mood and affect. Her behavior is normal.  Well groomed, good eye contact, normal speech and thoughts  Nursing note and vitals reviewed.  Results for orders placed or performed in visit on 11/18/16  HM MAMMOGRAPHY  Result Value Ref Range   HM Mammogram Self Reported Normal 0-4 Bi-Rad, Self Reported Normal      Assessment & Plan:   Problem List Items Addressed This Visit    Pain of right midfoot - Primary    Clinically consistent with MSK R mid dorsal  foot pain and swelling with overuse injury and underlying pes cavus with possible early bunion, increasing susceptibility to problem. Gradual worse x 3-4 weeks now - Able to weight bear, only mild limp if aggravated at end of day - Improved with conservative therapy, currently inadequate dose, limited rest, prolonged work standing  Plan: 1. Start Naproxen 561m BID wc 2-4 weeks then PRN 2. Tylenol PRN breakthrough 3. RICE therapy, Ice for swelling and elevation also ACE wrap, and heat PRN pain 4. Handout given foot exercises 5. Advised try to avoid excess time on feet, offered work note, defer for now - Try OTC insoles for arch support 6. Considered X-rays decide to defer today since no traumatic injury and pain duration only 3 weeks, not longer than 4-6 7. Offered referral to Podiatry for future custom insoles and supports possible injection - defer for now, only if worsening 8. Follow-up within 4-6 weeks if not improved - consider topical diclofenac, x-ray, refer to podiatry  *UPDATE - patient called back this afternoon and requested referral to Podiatrist, [facility]      Relevant Medications   naproxen (NAPROSYN) 500 MG tablet   Pes cavus      *Additionally regarding OA/DJD / Hand Pain - [facility], with RF negative, ESR negative, and ANA also negative - Patient is still requesting lab evaluation to confirm these prior tests and would like to proceed in addition to her other labs  Meds ordered this encounter  Medications  . naproxen (NAPROSYN) 500 MG tablet    Sig: Take 1 tablet (500 mg total) 2 (two) times daily with a meal by mouth. For 2-4 weeks then as needed    Dispense:  60 tablet    Refill:  1    Follow up plan: Return in about 4 weeks (around 09/10/2017) for Right midfoot pain.  Future labs ordered for 11/21/17  ANobie Putnam [facility] 08/13/2017, 5:36 PM

## 2017-08-13 NOTE — Patient Instructions (Addendum)
Thank you for coming to the clinic today.  1. Right midfoot arch pain  Recommend trial of Anti-inflammatory with Naproxen (Naprosyn) 545m tabs - take one with food and plenty of water TWICE daily every day (breakfast and dinner), for next 2 to 4 weeks, then you may take only as needed - DO NOT TAKE any ibuprofen, aleve, motrin while you are taking this medicine - It is safe to take Tylenol Ext Str 5043mtabs - take 1 to 2 (max dose 100038mevery 6 hours as needed for breakthrough pain, max 24 hour daily dose is 6 to 8 tablets or 4000m54montinue ice for swelling and heat for pain  Use RICE therapy: - R - Rest / relative rest with activity modification avoid overuse of joint - I - Ice packs (make sure you use a towel or sock / something to protect skin) - C - Compression with ACE wrap to apply pressure and reduce swelling allowing more support - E - Elevation - if significant swelling, lift leg above heart level (toes above your nose) to help reduce swelling, most helpful at night after day of being on your feet   PODIATRY  If you are interested in foot specialist for 2nd opinion, and consider insoles OTC first, go for arch support. It does appear you have high arches and concern for future bunion risk.  Internal Referral Tria[place]: [id number] [phone]: Open 8AM-5PM Phone: [phone] for FASTING BLOOD WORK (no food or drink after midnight before the lab appointment, only water or coffee without cream/sugar on the morning of)  SCHEDULE "Lab Only" visit in the morning at the clinic for lab draw in 3 MONTHS  - Make sure Lab Only appointment is at about 1 week before your next appointment, so that results will be available  For Lab Results, once available within 2-3 days of blood draw, you can can log in to MyChart online to view your results and a brief explanation. Also, we can discuss results at next follow-up  visit.   Please schedule a Follow-up Appointment to: Return in about 4 weeks (around 09/10/2017) for Right midfoot pain.  If you have any other questions or concerns, please feel free to call the clinic or send a message through [place] may also schedule an earlier appointment if necessary.  Additionally, you may be receiving a survey about your experience at our clinic within a few days to 1 week by e-mail or mail. We value your feedback.  Sheila Clarke [facility]  Arch Pain Rehabilitation Exercises   Stretching: You may begin exercising the muscles of your foot right away by gently stretching them with the towel stretch. When the towel stretch becomes too easy, you may begin doing the standing calf stretch and plantar fascia stretch.  Towel stretch: Sit on a hard surface with your injured leg stretched out in front of you. Loop a towel around the ball of your foot and pull the towel toward your body keeping your knee straight. Hold this position for 15 to 30 seconds then relax. Repeat 3 times. Standing calf stretch: Facing a wall, put your hands against the wall at about eye level. Keep the injured leg back, the uninjured leg forward, and the heel of your injured leg on the floor. Turn your injured foot slightly inward (as if you were pigeon-toed) as you slowly lean into the wall  until you feel a stretch in the back of your calf. Hold for 15 to 30 seconds. Repeat 3 times. Do this exercise several times each day. When you can stand comfortably on your injured foot, you can begin stretching the plantar fascia at the bottom of your foot.  Plantar fascia stretch: Stand with the ball of your injured foot on a stair. Reach for the bottom step with your heel until you feel a stretch in the arch of your foot. Hold this position for 15 to 30 seconds and then relax. Repeat 3 times. Static and dynamic balance exercises  Place a chair next to your  non-injured leg and stand upright. (This will provide you with balance if needed.) Stand on your injured foot. Try to raise the arch of your foot while keeping your toes on the floor. Try to maintain this position and balance on your injured side for 30 seconds. This exercise can be made more difficult by doing it on a piece of foam or a pillow, or with your eyes closed.  Stand in the same position as above. Keep your foot in this position and reach forward in front of you with your injured side's hand, allowing your knee to bend. Repeat this 10 times while maintaining the arch height. This exercise can be made more difficult by reaching farther in front of you. Do 2 sets.  Stand in the same position as above. While maintaining your arch height, reach the injured side's hand across your body toward the chair. The farther you reach, the more challenging the exercise. Do 2 sets of 10.  Towel pickup: With your heel on the ground, pick up a towel with your toes. Release. Repeat 10 to 20 times. When this gets easy, add more resistance by placing a book or small weight on the towel.  Frozen can roll: Roll your bare injured foot back and forth from your heel to your mid-arch over a frozen juice can. Repeat for 3 to 5 minutes. This exercise is particularly helpful if done first thing in the morning. Resisted dorsiflexion: Sit with your injured leg out straight and your foot facing a doorway. Tie a loop in one end of the tubing. Put your foot through the loop so that the tubing goes around the arch of your foot. Tie a knot in the other end of the tubing and shut the knot in the door. Move backward until there is tension in the tubing. Keeping your knee straight, pull your foot toward your body, stretching the tubing. Slowly return to the starting position. Do 3 sets of 10.  Next, you can begin strengthening the muscles of your foot and lower leg by doing the rest of the exercises.  Strengthening Resisted  plantar flexion: Sit with your leg outstretched and loop the middle section of the tubing around the ball of your foot. Hold the ends of the tubing in both hands. Gently press the ball of your foot down and point your toes, stretching the tubing. Return to the starting position. Do 3 sets of 10.  Resisted inversion: Sit with your legs out straight and cross your uninjured leg over your injured ankle. Wrap the tubing around the ball of your injured foot and then loop it around your uninjured foot so that the tubing is anchored there at one end. Hold the other end of the tubing in your hand. Turn your injured foot inward and upward. This will stretch the tubing. Return to the starting position. Do  3 sets of 10.  Resisted eversion: Sit with both legs stretched out in front of you, with your feet about a shoulder's width apart. Tie a loop in one end of the tubing. Put your injured foot through the loop so that the tubing goes around the arch of that foot and wraps around the outside of the uninjured foot. Hold onto the other end of the tubing with your hand to provide tension. Turn your injured foot up and out. Make sure you keep your uninjured foot still so that it will allow the tubing to stretch as you move your injured foot. Return to the starting position. Do 3 sets of 10.

## 2017-08-13 NOTE — Telephone Encounter (Signed)
Referral to [facility] Podiatry placed.  Also offered her work note, but declined, if she changes mind we can write her out for either Thursday 08/14/17 or Friday 08/15/17, or in future if flare up, as she was advised to avoid prolonged standing to let her foot heal.  Nobie Putnam, DO [facility] 08/13/2017, 5:21 PM

## 2017-08-14 NOTE — Telephone Encounter (Signed)
Left detail message.

## 2017-09-08 ENCOUNTER — Encounter: Payer: Self-pay | Admitting: Podiatry

## 2017-09-08 ENCOUNTER — Ambulatory Visit (INDEPENDENT_AMBULATORY_CARE_PROVIDER_SITE_OTHER): Payer: 59 | Admitting: Podiatry

## 2017-09-08 ENCOUNTER — Ambulatory Visit (INDEPENDENT_AMBULATORY_CARE_PROVIDER_SITE_OTHER): Payer: 59

## 2017-09-08 VITALS — BP 110/74 | HR 73 | Resp 16

## 2017-09-08 DIAGNOSIS — M7751 Other enthesopathy of right foot: Secondary | ICD-10-CM

## 2017-09-08 DIAGNOSIS — M778 Other enthesopathies, not elsewhere classified: Secondary | ICD-10-CM

## 2017-09-08 DIAGNOSIS — M779 Enthesopathy, unspecified: Secondary | ICD-10-CM

## 2017-09-08 DIAGNOSIS — Q6671 Congenital pes cavus, right foot: Secondary | ICD-10-CM

## 2017-09-08 DIAGNOSIS — Q667 Congenital pes cavus: Secondary | ICD-10-CM | POA: Diagnosis not present

## 2017-09-08 NOTE — Progress Notes (Signed)
  Subjective:  Patient ID: Sheila Clarke, female    DOB: 02-Aug-1976,  MRN: [id number] HPI Chief Complaint  Patient presents with  . Foot Pain    Dorsal foot right - aching, dull pain since 07/23/17-woke up with pain that morning. swelling, no injury, tried ice, heat, acewrap, PCP rx'd naproxen only took for 2 weeks-made her dizzy    41 y.o. female presents with the above complaint.     Past Medical History:  Diagnosis Date  . Panic attacks    Past Surgical History:  Procedure Laterality Date  . APPENDECTOMY      Current Outpatient Medications:  .  citalopram (CELEXA) 20 MG tablet, Take 0.5-1 tablets (10-20 mg total) by mouth daily., Disp: 30 tablet, Rfl: 11  No Known Allergies Review of Systems  Gastrointestinal: Positive for constipation.  Musculoskeletal: Positive for arthralgias and myalgias.  Neurological: Positive for weakness and numbness.  All other systems reviewed and are negative.  Objective:  There were no vitals filed for this visit.  General: Well developed, nourished, in no acute distress, alert and oriented x3   Dermatological: Skin is warm, dry and supple bilateral. Nails x 10 are well maintained; remaining integument appears unremarkable at this time. There are no open sores, no preulcerative lesions, no rash or signs of infection present.  Vascular: Dorsalis Pedis artery and Posterior Tibial artery pedal pulses are 2/4 bilateral with immedate capillary fill time. Pedal hair growth present. No varicosities and no lower extremity edema present bilateral.   Neruologic: Grossly intact via light touch bilateral. Vibratory intact via tuning fork bilateral. Protective threshold with Semmes Wienstein monofilament intact to all pedal sites bilateral. Patellar and Achilles deep tendon reflexes 2+ bilateral. No Babinski or clonus noted bilateral.   Musculoskeletal: No gross boney pedal deformities bilateral. No pain, crepitus, or limitation noted with foot and  ankle range of motion bilateral. Muscular strength 5/5 in all groups tested bilateral.  Gait: Unassisted, Nonantalgic.    Radiographs:  Some early joint space narrowing at the level of the second tarsometatarsal joint. No fractures identified. Elongated second metatarsal mild hallux valgus right foot.  Assessment & Plan:   Assessment: capsulitis osteoarthritis dorsal aspect right foot. Neuritis right foot.  Plan: injected the dorsal aspect of the right foot today with Kenalog and local anesthetic 20 mg of Kenalog 5 mg of local anesthetic to the dorsal tarsometatarsal joint area after sterile Betadine skin prep. She tolerated procedure well without complications. Will follow up with her in the near future for reevaluation.     Max T. [place], [place]

## 2017-10-08 ENCOUNTER — Ambulatory Visit: Payer: 59 | Admitting: Podiatry

## 2017-10-13 ENCOUNTER — Other Ambulatory Visit: Payer: Self-pay

## 2017-10-14 MED ORDER — NAPROXEN 500 MG PO TABS: ORAL_TABLET | ORAL | 0 refills | Status: DC

## 2017-10-15 ENCOUNTER — Other Ambulatory Visit: Payer: Self-pay | Admitting: Family Medicine

## 2017-10-15 DIAGNOSIS — Z1231 Encounter for screening mammogram for malignant neoplasm of breast: Secondary | ICD-10-CM

## 2017-11-21 ENCOUNTER — Ambulatory Visit
Admission: RE | Admit: 2017-11-21 | Discharge: 2017-11-21 | Disposition: A | Discharge status: Home / Self Care | Payer: 59 | Source: Ambulatory Visit | Attending: Family Medicine | Admitting: Family Medicine

## 2017-11-21 ENCOUNTER — Other Ambulatory Visit: Payer: Self-pay | Admitting: Family Medicine

## 2017-11-21 ENCOUNTER — Encounter: Payer: Self-pay | Admitting: Family Medicine

## 2017-11-21 ENCOUNTER — Other Ambulatory Visit: Payer: 59

## 2017-11-21 ENCOUNTER — Ambulatory Visit (INDEPENDENT_AMBULATORY_CARE_PROVIDER_SITE_OTHER): Payer: 59 | Admitting: Family Medicine

## 2017-11-21 VITALS — BP 100/70 | HR 70 | Temp 98.7°F | Resp 16 | Ht 61.0 in | Wt 122.0 lb

## 2017-11-21 DIAGNOSIS — F064 Anxiety disorder due to known physiological condition: Secondary | ICD-10-CM | POA: Diagnosis not present

## 2017-11-21 DIAGNOSIS — M150 Primary generalized (osteo)arthritis: Secondary | ICD-10-CM

## 2017-11-21 DIAGNOSIS — R799 Abnormal finding of blood chemistry, unspecified: Secondary | ICD-10-CM | POA: Diagnosis not present

## 2017-11-21 DIAGNOSIS — Z0000 Encounter for general adult medical examination without abnormal findings: Secondary | ICD-10-CM

## 2017-11-21 DIAGNOSIS — Z1231 Encounter for screening mammogram for malignant neoplasm of breast: Secondary | ICD-10-CM | POA: Insufficient documentation

## 2017-11-21 DIAGNOSIS — M159 Polyosteoarthritis, unspecified: Secondary | ICD-10-CM

## 2017-11-21 DIAGNOSIS — Z862 Personal history of diseases of the blood and blood-forming organs and certain disorders involving the immune mechanism: Secondary | ICD-10-CM

## 2017-11-21 DIAGNOSIS — M79642 Pain in left hand: Secondary | ICD-10-CM

## 2017-11-21 DIAGNOSIS — M79641 Pain in right hand: Secondary | ICD-10-CM

## 2017-11-21 DIAGNOSIS — F410 Panic disorder [episodic paroxysmal anxiety] without agoraphobia: Secondary | ICD-10-CM

## 2017-11-21 MED ORDER — CITALOPRAM HYDROBROMIDE 20 MG PO TABS: 10.0000 mg | ORAL_TABLET | Freq: Every day | ORAL | 11 refills | Status: DC

## 2017-11-21 NOTE — Assessment & Plan Note (Signed)
Stable, chronic anxiety >20 years with infrequent panic attacks, without other complications Not consistent with GAD, or associated mood disorder. Well functioning. -GAD7: 0 > to 1 now / PHQ9: 0 Controlled on Celexa Med history: prior on Klonopin, Wellbutrin Not followed by Psychiatry / Psychology  Plan: 1. Continue current med - Celexa 58m tabs (refilled today) - taking half tab 160mdaily most days, rarely will use whole tab 2025mor few days to < 1 week at a time if flare or stressors - seems to work for patient, reviewed likely needs longer than 1-2 weeks to have full effect of higher dose, but she is improved on short course only 2. Continue self management of rare panic attacks - breathing technique, no additional treatment needed 3. Consider psychology / therapy in future if needed 4. Follow-up 1 year for Annual Physical, GAD7/PHQ9

## 2017-11-21 NOTE — Patient Instructions (Addendum)
Thank you for coming to the office today.  1. Keep up the good work overall!  SIgned form for insurance annual physical  If swelling of ankles improves with elevation and rest then I am not worried as long as not painful or other changes  May try OTC hydrocortisone for dry scaly skin on foot 1-2 times daily for 1 week then use regular moisturziers  For constipation may try fiber supplement metamucil in future for more bulking can help  Refilled Celexa  WIll notify you about lab results next week  DUE for FASTING BLOOD WORK (no food or drink after midnight before the lab appointment, only water or coffee without cream/sugar on the morning of)  SCHEDULE "Lab Only" visit in the morning at the clinic for lab draw in 1 YEAR  - Make sure Lab Only appointment is at about 1 week before your next appointment, so that results will be available  For Lab Results, once available within 2-3 days of blood draw, you can can log in to MyChart online to view your results and a brief explanation. Also, we can discuss results at next follow-up visit.   Please schedule a Follow-up Appointment to: Return in about 1 year (around 11/21/2018) for Annual Physical.    If you have any other questions or concerns, please feel free to call the office or send a message through [place]. You may also schedule an earlier appointment if necessary.  Additionally, you may be receiving a survey about your experience at our office within a few days to 1 week by e-mail or mail. We value your feedback.  Nobie Putnam, DO [place]

## 2017-11-21 NOTE — Progress Notes (Signed)
Subjective:    Patient ID: Sheila Clarke, female    DOB: 07-Jun-1976, 42 y.o.   MRN: [id number]  Sheila Clarke is a 42 y.o. female presenting on 11/21/2017 for Annual Exam   HPI   Here for Annual Physical and had Labs drawn today, pending results.  Lifestyle She feels good overall and has improved some lifestyle habits Diet: Reduced calorie count, portion size reduced, reduced sweet/soft drinks, now drinking more water Exercise: Limited but tries to stay active - Some wt loss up to 9 lbs in 1 year  FOLLOW-UP R Foot Pain / Other joint pains (hands, knuckles) Last visit with me 08/13/17, she was given rx naproxen and refer to Podiatry. Last visit saw [facility] on 09/08/17 had injection in R foot, thought possible capsulitis vs OA/DJD of dorsal R foot, had some flare up since shot but overall much improved. She had a follow-up but cancelled it since improved - She currently still has slight bony deformity of R foot arch, but seems to be stable, now no pain, has some intermittent swelling of this area with standing on feet long hours. Improved overnight with resting and elevation. - Admits similar swelling within ankles as well, but now not painful  Anxiety, Chronic with Panic Attacks - Last visit with me 12/2016 in past to establish care, same problem controlled with Celexa, see prior notes for background information. - Interval update with no significant changes, still taking med, she seems to have mild worsening anxiety in winter with inc work load and feeling "pulled in different directions" - Today patient reports no concerns, request refill on Celexa, rarely takes the whole tablet for 1 week but does this during stressful times - Taking Celexa 42m (HALF tab of 256m most often daily   Health Maintenance: Declines routine HIV screen UTD TDap, Flu vaccines  Colon CA Screening: Never had colonoscopy. Currently asymptomatic. No known family history of colon CA. Not due for  screening test at this time, considered age >4>33+ue to [place], will consider.  Breast CA Screening: Due for mammogram screening, age >4>14[facility], has had several negatives before. No prior history abnormal mammogram. No known family history of breast cancer. Currently asymptomatic. Already has mammogram scheduled for today 11/21/17.   [facility] 11/21/2017 12/25/2016 11/19/2016  Decreased Interest 0 0 0  Down, Depressed, Hopeless 0 0 0  PHQ - 2 Score 0 0 0  Altered sleeping 0 - -  Tired, decreased energy 0 - -  Change in appetite 0 - -  Feeling bad or failure about yourself  0 - -  Trouble concentrating 0 - -  Moving slowly or fidgety/restless 0 - -  Suicidal thoughts 0 - -  PHQ-9 Score 0 - -  Difficult doing work/chores Not difficult at all - -   GAD 7 : Generalized Anxiety Score 11/21/2017 12/25/2016  Nervous, Anxious, on Edge 1 0  Control/stop worrying 0 0  Worry too much - different things 0 0  Trouble relaxing 0 0  Restless 0 0  Easily annoyed or irritable 0 0  Afraid - awful might happen 0 0  Total GAD 7 Score 1 0  Anxiety Difficulty Not difficult at all Not difficult at all     Past Medical History:  Diagnosis Date  . Panic attacks    Past Surgical History:  Procedure Laterality Date  . APPENDECTOMY  Social History   Socioeconomic History  . Marital status: Married    Spouse name: Not on file  . Number of children: Not on file  . Years of education: Not on file  . Highest education level: Not on file  Social Needs  . Financial resource strain: Not on file  . Food insecurity - worry: Not on file  . Food insecurity - inability: Not on file  . Transportation needs - medical: Not on file  . Transportation needs - non-medical: Not on file  Occupational History  . Not on file  Tobacco Use  . Smoking status: Never Smoker  . Smokeless tobacco: Never Used  Substance and Sexual  Activity  . Alcohol use: No  . Drug use: No  . Sexual activity: Yes  Other Topics Concern  . Not on file  Social History Narrative  . Not on file   Family History  Problem Relation Age of Onset  . Congestive Heart Failure Mother   . Asthma Mother   . Cancer Father        skin, lymphoma  . Heart disease Father   . Skin cancer Father   . Lymphoma Father   . Breast cancer Neg Hx    No current outpatient medications on file prior to visit.   No current facility-administered medications on file prior to visit.     Review of Systems  Constitutional: Negative for activity change, appetite change, chills, diaphoresis, fatigue and fever.  HENT: Negative for congestion and hearing loss.   Eyes: Positive for itching (recent had some watery itchy eyes, allergy outdoor exposure cutting grass). Negative for visual disturbance.  Respiratory: Negative for apnea, cough, choking, chest tightness, shortness of breath and wheezing.   Cardiovascular: Negative for chest pain, palpitations and leg swelling.  Gastrointestinal: Negative for abdominal pain, anal bleeding, blood in stool, constipation, diarrhea, nausea and vomiting.  Endocrine: Negative for cold intolerance and polyuria.  Genitourinary: Negative for difficulty urinating, dysuria, frequency, hematuria, menstrual problem, pelvic pain and urgency.  Musculoskeletal: Negative for arthralgias, back pain and neck pain.  Skin: Negative for rash.  Allergic/Immunologic: Negative for environmental allergies.  Neurological: Negative for dizziness, weakness, light-headedness, numbness and headaches.  Hematological: Negative for adenopathy.  Psychiatric/Behavioral: Negative for behavioral problems, dysphoric mood and sleep disturbance. The patient is not nervous/anxious.    Per HPI unless specifically indicated above     Objective:    BP 100/70   Pulse 70   Temp 98.7 F (37.1 C) (Oral)   Resp 16   Ht _0  (1.549 m)   Wt 122 lb (55.3 kg)    SpO2 100%   BMI 23.05 kg/m   Wt Readings from Last 3 Encounters:  11/21/17 122 lb (55.3 kg)  08/13/17 129 lb (58.5 kg)  12/25/16 131 lb (59.4 kg)    Physical Exam  Constitutional: She is oriented to person, place, and time. She appears well-developed and well-nourished. No distress.  Well-appearing, comfortable, cooperative  HENT:  Head: Normocephalic and atraumatic.  Mouth/Throat: Oropharynx is clear and moist.  Frontal / maxillary sinuses non-tender. Nares patent without purulence or edema. Bilateral TMs clear except R TM has mild residual clear effusion, without erythema or bulging. Oropharynx clear without erythema, exudates, edema or asymmetry.  Eyes: Conjunctivae and EOM are normal. Pupils are equal, round, and reactive to light. Right eye exhibits no discharge. Left eye exhibits no discharge.  Neck: Normal range of motion. Neck supple. No thyromegaly present.  Cardiovascular: Normal rate, regular rhythm,  normal heart sounds and intact distal pulses.  No murmur heard. Pulmonary/Chest: Effort normal and breath sounds normal. No respiratory distress. She has no wheezes. She has no rales.  Abdominal: Soft. Bowel sounds are normal. She exhibits no distension and no mass. There is no tenderness.  Musculoskeletal: Normal range of motion. She exhibits no edema or tenderness.  Upper / Lower Extremities: - Normal muscle tone, strength bilateral upper extremities 5/5, lower extremities 5/5  R foot stable slight deformity of pes cavus arch mid foot with bony palpable area of prior pain, now non tender.  Lymphadenopathy:    She has no cervical adenopathy.  Neurological: She is alert and oriented to person, place, and time.  Distal sensation intact to light touch all extremities  Skin: Skin is warm and dry. No rash noted. She is not diaphoretic. No erythema.  Mild dry skin scaly patch over R foot  Psychiatric: She has a normal mood and affect. Her behavior is normal.  Well groomed, good  eye contact, normal speech and thoughts  Nursing note and vitals reviewed.  Results for orders placed or performed in visit on 11/18/16  [facility] MAMMOGRAPHY  Result Value Ref Range   HM Mammogram Self Reported Normal 0-4 Bi-Rad, Self Reported Normal      Assessment & Plan:   Problem List Items Addressed This Visit    Anxiety disorder due to general medical condition with panic attack    Stable, chronic anxiety >20 years with infrequent panic attacks, without other complications Not consistent with GAD, or associated mood disorder. Well functioning. -GAD7: 0 > to 1 now / PHQ9: 0 Controlled on Celexa Med history: prior on Klonopin, Wellbutrin Not followed by Psychiatry / Psychology  Plan: 1. Continue current med - Celexa 70m tabs (refilled today) - taking half tab 168mdaily most days, rarely will use whole tab 2078mor few days to < 1 week at a time if flare or stressors - seems to work for patient, reviewed likely needs longer than 1-2 weeks to have full effect of higher dose, but she is improved on short course only 2. Continue self management of rare panic attacks - breathing technique, no additional treatment needed 3. Consider psychology / therapy in future if needed 4. Follow-up 1 year for Annual Physical, GAD7/PHQ9       Relevant Medications   citalopram (CELEXA) 20 MG tablet   Primary osteoarthritis involving multiple joints    Suspected most likely diagnosis especially with R foot, reviewed prior x-ray report, likely other joints involved hands as well Concern possible other inflammatory condition seems less likely RA, pending screening tests, prior negative Followed by Podiatry, improved s/p injection Limit NSAIDs, may take OTC Aleve if need, Tylenol Follow-up       Other Visit Diagnoses    Annual physical exam    -  Primary UTD Health Maintenance Encourage continue improved diet/lifestyle Completed form for insurance authorizing completed physical today, she will have  labs for review to submit    Bilateral hand pain     Concern possible OA vs other arthritic condition, question if inflammatory vs RA given bilateral hands / small joints, prior work-up - now pending labs as well ESR, CRP anti-CCP    History of anemia     Check labs, pending CBC   Abnormal blood chemistry          Meds ordered this encounter  Medications  . citalopram (CELEXA) 20 MG tablet    Sig: Take 0.5-1 tablets (10-20  mg total) by mouth daily.    Dispense:  30 tablet    Refill:  11    Please keep refills on file, for when patient is ready    Follow up plan: Return in about 1 year (around 11/21/2018) for Annual Physical.  Nobie Putnam, DO [facility] 11/21/2017, 12:50 PM

## 2017-11-21 NOTE — Assessment & Plan Note (Signed)
Suspected most likely diagnosis especially with R foot, reviewed prior x-ray report, likely other joints involved hands as well Concern possible other inflammatory condition seems less likely RA, pending screening tests, prior negative Followed by Podiatry, improved s/p injection Limit NSAIDs, may take OTC Aleve if need, Tylenol Follow-up

## 2017-11-24 ENCOUNTER — Other Ambulatory Visit: Payer: Self-pay | Admitting: Family Medicine

## 2017-11-24 DIAGNOSIS — F410 Panic disorder [episodic paroxysmal anxiety] without agoraphobia: Secondary | ICD-10-CM

## 2017-11-24 DIAGNOSIS — F064 Anxiety disorder due to known physiological condition: Secondary | ICD-10-CM

## 2017-11-24 DIAGNOSIS — R799 Abnormal finding of blood chemistry, unspecified: Secondary | ICD-10-CM

## 2017-11-24 DIAGNOSIS — M159 Polyosteoarthritis, unspecified: Secondary | ICD-10-CM

## 2017-11-24 DIAGNOSIS — Z0000 Encounter for general adult medical examination without abnormal findings: Secondary | ICD-10-CM

## 2017-11-24 DIAGNOSIS — M150 Primary generalized (osteo)arthritis: Secondary | ICD-10-CM

## 2017-11-24 LAB — CBC WITH DIFFERENTIAL/PLATELET
BASOS ABS: 20 {cells}/uL (ref 0–200)
Basophils Relative: 0.5 %
Eosinophils Absolute: 68 cells/uL (ref 15–500)
Eosinophils Relative: 1.7 %
HCT: 37.3 % (ref 35.0–45.0)
HEMOGLOBIN: 12.8 g/dL (ref 11.7–15.5)
Lymphs Abs: 1256 cells/uL (ref 850–3900)
MCH: 30.8 pg (ref 27.0–33.0)
MCHC: 34.3 g/dL (ref 32.0–36.0)
MCV: 89.9 fL (ref 80.0–100.0)
MONOS PCT: 9.2 %
MPV: 12.5 fL (ref 7.5–12.5)
NEUTROS ABS: 2288 {cells}/uL (ref 1500–7800)
NEUTROS PCT: 57.2 %
PLATELETS: 193 10*3/uL (ref 140–400)
RBC: 4.15 10*6/uL (ref 3.80–5.10)
RDW: 11.8 % (ref 11.0–15.0)
TOTAL LYMPHOCYTE: 31.4 %
WBC mixed population: 368 cells/uL (ref 200–950)
WBC: 4 10*3/uL (ref 3.8–10.8)

## 2017-11-24 LAB — LIPID PANEL
CHOL/HDL RATIO: 3 (calc) (ref <5.0)
Cholesterol: 169 mg/dL (ref <200)
HDL: 56 mg/dL (ref >50)
LDL CHOLESTEROL (CALC): 99 mg/dL
NON-HDL CHOLESTEROL (CALC): 113 mg/dL (ref <130)
TRIGLYCERIDES: 54 mg/dL (ref <150)

## 2017-11-24 LAB — COMPLETE METABOLIC PANEL WITH GFR
AG Ratio: 1.8 (calc) (ref 1.0–2.5)
ALKALINE PHOSPHATASE (APISO): 32 U/L — AB (ref 33–115)
ALT: 9 U/L (ref 6–29)
AST: 15 U/L (ref 10–30)
Albumin: 4.1 g/dL (ref 3.6–5.1)
BILIRUBIN TOTAL: 0.5 mg/dL (ref 0.2–1.2)
BUN: 8 mg/dL (ref 7–25)
CHLORIDE: 106 mmol/L (ref 98–110)
CO2: 24 mmol/L (ref 20–32)
CREATININE: 0.62 mg/dL (ref 0.50–1.10)
Calcium: 9.2 mg/dL (ref 8.6–10.2)
GFR, Est African American: 128 mL/min/{1.73_m2} (ref >=60)
GFR, Est Non African American: 110 mL/min/{1.73_m2} (ref >=60)
GLUCOSE: 82 mg/dL (ref 65–99)
Globulin: 2.3 g/dL (calc) (ref 1.9–3.7)
Potassium: 4.6 mmol/L (ref 3.5–5.3)
Sodium: 138 mmol/L (ref 135–146)
Total Protein: 6.4 g/dL (ref 6.1–8.1)

## 2017-11-24 LAB — SEDIMENTATION RATE: SED RATE: 6 mm/h (ref 0–20)

## 2017-11-24 LAB — TSH: TSH: 2.46 m[IU]/L

## 2017-11-24 LAB — HEMOGLOBIN A1C
EAG (MMOL/L): 6 (calc)
Hgb A1c MFr Bld: 5.4 % of total Hgb (ref <5.7)
Mean Plasma Glucose: 108 (calc)

## 2017-11-24 LAB — C-REACTIVE PROTEIN

## 2017-11-24 LAB — CYCLIC CITRUL PEPTIDE ANTIBODY, IGG: Cyclic Citrullin Peptide Ab: <16 UNITS

## 2017-11-25 ENCOUNTER — Encounter: Payer: Self-pay | Admitting: Family Medicine

## 2018-01-09 ENCOUNTER — Telehealth: Payer: Self-pay | Admitting: Family Medicine

## 2018-01-09 NOTE — Telephone Encounter (Signed)
Pt would like a referral to Dr. Roosvelt Harps office.  She was referred to a dermatologist in [facility] and didn't care for them.  She said she would like a full body check but is not having any problems now [phone]

## 2018-01-13 NOTE — Telephone Encounter (Signed)
Left message for patient to call back  

## 2018-07-19 DIAGNOSIS — Z23 Encounter for immunization: Secondary | ICD-10-CM | POA: Diagnosis not present

## 2018-11-20 ENCOUNTER — Other Ambulatory Visit: Payer: Self-pay

## 2018-11-20 DIAGNOSIS — F064 Anxiety disorder due to known physiological condition: Secondary | ICD-10-CM

## 2018-11-20 DIAGNOSIS — R799 Abnormal finding of blood chemistry, unspecified: Secondary | ICD-10-CM

## 2018-11-20 DIAGNOSIS — F410 Panic disorder [episodic paroxysmal anxiety] without agoraphobia: Secondary | ICD-10-CM

## 2018-11-20 DIAGNOSIS — Z0000 Encounter for general adult medical examination without abnormal findings: Secondary | ICD-10-CM

## 2018-11-23 ENCOUNTER — Other Ambulatory Visit: Payer: 59

## 2018-11-23 DIAGNOSIS — Z0000 Encounter for general adult medical examination without abnormal findings: Secondary | ICD-10-CM | POA: Diagnosis not present

## 2018-11-23 DIAGNOSIS — R799 Abnormal finding of blood chemistry, unspecified: Secondary | ICD-10-CM | POA: Diagnosis not present

## 2018-11-24 LAB — COMPLETE METABOLIC PANEL WITHOUT GFR
AG Ratio: 1.7 (calc) (ref 1.0–2.5)
ALT: 8 U/L (ref 6–29)
AST: 14 U/L (ref 10–30)
Albumin: 4 g/dL (ref 3.6–5.1)
Alkaline phosphatase (APISO): 34 U/L (ref 31–125)
BUN: 10 mg/dL (ref 7–25)
CO2: 25 mmol/L (ref 20–32)
Calcium: 9 mg/dL (ref 8.6–10.2)
Chloride: 107 mmol/L (ref 98–110)
Creat: 0.55 mg/dL (ref 0.50–1.10)
GFR, Est African American: 132 mL/min/1.73m2
GFR, Est Non African American: 114 mL/min/1.73m2
Globulin: 2.4 g/dL (ref 1.9–3.7)
Glucose, Bld: 84 mg/dL (ref 65–99)
Potassium: 4.5 mmol/L (ref 3.5–5.3)
Sodium: 137 mmol/L (ref 135–146)
Total Bilirubin: 0.4 mg/dL (ref 0.2–1.2)
Total Protein: 6.4 g/dL (ref 6.1–8.1)

## 2018-11-24 LAB — CBC WITH DIFFERENTIAL/PLATELET
ABSOLUTE MONOCYTES: 374 {cells}/uL (ref 200–950)
BASOS ABS: 31 {cells}/uL (ref 0–200)
BASOS PCT: 0.8 %
EOS PCT: 2.1 %
Eosinophils Absolute: 82 cells/uL (ref 15–500)
HCT: 36.8 % (ref 35.0–45.0)
HEMOGLOBIN: 12.5 g/dL (ref 11.7–15.5)
LYMPHS ABS: 1178 {cells}/uL (ref 850–3900)
MCH: 31.3 pg (ref 27.0–33.0)
MCHC: 34 g/dL (ref 32.0–36.0)
MCV: 92.2 fL (ref 80.0–100.0)
MONOS PCT: 9.6 %
MPV: 13.1 fL — ABNORMAL HIGH (ref 7.5–12.5)
NEUTROS ABS: 2235 {cells}/uL (ref 1500–7800)
NEUTROS PCT: 57.3 %
PLATELETS: 165 10*3/uL (ref 140–400)
RBC: 3.99 10*6/uL (ref 3.80–5.10)
RDW: 12.3 % (ref 11.0–15.0)
Total Lymphocyte: 30.2 %
WBC: 3.9 10*3/uL (ref 3.8–10.8)

## 2018-11-24 LAB — LIPID PANEL
Cholesterol: 176 mg/dL (ref <200)
HDL: 63 mg/dL (ref >=50)
LDL Cholesterol (Calc): 102 mg/dL (calc) — ABNORMAL HIGH
NON-HDL CHOLESTEROL (CALC): 113 mg/dL (ref <130)
Total CHOL/HDL Ratio: 2.8 (calc) (ref <5.0)
Triglycerides: 38 mg/dL (ref <150)

## 2018-11-24 LAB — HEMOGLOBIN A1C
EAG (MMOL/L): 6 (calc)
HEMOGLOBIN A1C: 5.4 %{Hb} (ref <5.7)
MEAN PLASMA GLUCOSE: 108 (calc)

## 2018-11-27 ENCOUNTER — Other Ambulatory Visit: Payer: Self-pay

## 2018-11-27 ENCOUNTER — Ambulatory Visit (INDEPENDENT_AMBULATORY_CARE_PROVIDER_SITE_OTHER): Payer: 59 | Admitting: Family Medicine

## 2018-11-27 ENCOUNTER — Encounter: Payer: Self-pay | Admitting: Family Medicine

## 2018-11-27 VITALS — BP 102/75 | HR 66 | Temp 99.4°F | Resp 16 | Ht 61.0 in | Wt 124.0 lb

## 2018-11-27 DIAGNOSIS — Z85828 Personal history of other malignant neoplasm of skin: Secondary | ICD-10-CM

## 2018-11-27 DIAGNOSIS — F410 Panic disorder [episodic paroxysmal anxiety] without agoraphobia: Secondary | ICD-10-CM | POA: Diagnosis not present

## 2018-11-27 DIAGNOSIS — Z0000 Encounter for general adult medical examination without abnormal findings: Secondary | ICD-10-CM

## 2018-11-27 DIAGNOSIS — M150 Primary generalized (osteo)arthritis: Secondary | ICD-10-CM

## 2018-11-27 DIAGNOSIS — F064 Anxiety disorder due to known physiological condition: Secondary | ICD-10-CM

## 2018-11-27 DIAGNOSIS — M159 Polyosteoarthritis, unspecified: Secondary | ICD-10-CM

## 2018-11-27 DIAGNOSIS — M7711 Lateral epicondylitis, right elbow: Secondary | ICD-10-CM | POA: Diagnosis not present

## 2018-11-27 MED ORDER — CITALOPRAM HYDROBROMIDE 20 MG PO TABS: 10.0000 mg | ORAL_TABLET | Freq: Every day | ORAL | 11 refills | Status: DC

## 2018-11-27 NOTE — Patient Instructions (Addendum)
Thank you for coming to the office today.  Next due Pap Smear 01/2020 - [facility] - call to schedule. - your last test was April 2018.  Last mammogram 11/2017 - next due in 11/2019  ------------------------------- Referral to Dermatology stay tuned for apt - call them if you dont hear back.  [name]   [address], [place] [address] Hours: 8AM-5PM Phone: [phone]   ---------------------------------  Overall excellent results.  1. Chemistry - Normal results, including electrolytes, kidney and liver function. - Normal fasting blood sugar   2. Hemoglobin A1c (Diabetes screening) - 5.4, normal not in range of Pre-Diabetes (>5.7 to 6.4)   3. CBC Blood Counts - Normal, no anemia, no other significant abnormality  4. Cholesterol - Normal cholesterol results. including HDL (good cholesterol), LDL (bad cholesterol), and Triglycerides.  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  Recommend trial of Anti-inflammatory with Naproxen OTC (Aleve) 265m tabs - take one to two with food and plenty of water TWICE daily every day (breakfast and dinner), for next 1 to 2 weeks, then you may take only as needed - DO NOT TAKE any ibuprofen, advil, aleve, motrin while you are taking this medicine - It is safe to take Tylenol Ext Str 5035mtabs - take 1 to 2 (max dose 100042mevery 6 hours as needed for breakthrough pain, max 24 hour daily dose is 6 to 8 tablets or 4000m45mse RICE therapy: - R -  Rest / relative rest with activity modification avoid overuse of joint - I - Ice packs (make sure you use a towel or sock / something to protect skin) - C - Compression with ACE wrap to apply pressure and reduce swelling allowing more support  Try a Forearm Tennis Elbow Strap over muscle as demonstrated - use with repetitive activity to reduce strain of muscle      May consider Physical Therapy referral if interested  DUE for FASTING BLOOD WORK (no food or drink after midnight before the lab appointment, only water or coffee without cream/sugar on the morning of)  SCHEDULE "Lab Only" visit in the morning at the clinic for lab draw in 1 YEAR  - Make sure Lab Only appointment is at about 1 week before your next appointment, so that results will be available  For Lab Results, once available within 2-3 days of blood draw, you can can log in to MyChart online to view your results and a brief explanation. Also, we can discuss results at next follow-up visit.    Please schedule a Follow-up Appointment to: Return in about 1 year (around 11/28/2019) for Annual Physical.  If you have any other questions or concerns, please feel free to call the office or send a message through [facility] may also schedule an earlier appointment if necessary.  Additionally, you may be receiving a survey about your experience at our office within a few days to 1 week by e-mail or mail. We value your feedback.  Sheila Clarke  [place]

## 2018-11-27 NOTE — Assessment & Plan Note (Signed)
Stable, chronic anxiety >20 years with infrequent panic attacks, without other complications Not consistent with GAD, or associated mood disorder. Well functioning. -GAD7: 0 / PHQ9: 0 Controlled on Celexa Med history: prior on Klonopin, Wellbutrin Not followed by Psychiatry / Psychology  Plan: 1. Continue current med - Celexa 65m tabs (refilled today) - sometimes takes half dose instead 2. Continue self management of rare panic attacks - breathing technique, no additional treatment needed 3. Consider psychology / therapy in future if needed 4. Follow-up 1 year for Annual Physical, GAD7/PHQ9

## 2018-11-27 NOTE — Assessment & Plan Note (Signed)
Reported 2 abnormal skin lesions, biopsy/removed from R breast / trunk - diagnosed with skin cancer, unsure what type (approx 2013 - no known history of melanoma) - Fam history of skin cancer  - Referral to Dermatology = [place] for surveillance preventative

## 2018-11-27 NOTE — Assessment & Plan Note (Signed)
Stable, multiple joint mild arthritic symptoms Limited therapy needed Follow-up PRN

## 2018-11-27 NOTE — Progress Notes (Signed)
Subjective:    Patient ID: Sheila Clarke, female    DOB: 16-Jun-1976, 43 y.o.   MRN: [id number]  Sheila Clarke is a 43 y.o. female presenting on 11/27/2018 for Annual Exam   HPI   Here for Annual Phyiscal and Lab Review. She has biometric form to sign documenting physical today.  General Wellness / Lab Review / Lifestyle - Today she is doing well overall - Her lab results showed normal biometric A1c and Lipids - No concerns on chemistry - She remains active at work, now up to 6 days a week, some repetitive strenuous activity packing - Diet is balanced, limit portion, tries to improve diet - Has arthritis, some mild aches and pain in joints such as hands fingers  Anxiety Reports doing well. She continues Citalopram half to whole tab nearly every day and does well controls her anxiety.    Additional complaints:  Right Lateral Epicondylitis - Repetitive activities with packing, she reaches and pulls and lifts for work, a lot of box work this past season, recent flare up in hand and swelling, feels inflamed but has good range of motion shoulder. Worse soreness in R forearm muscle bulk. - Taking ibuprofen x 1 per dose 1-2 times a week, very minimal dose, mild relief - Not tried brace or sleeve - Admits occasional joint swelling in R hand - Denies any acute injury, ecchymosis, persistent numbness tingling  History of Skin Cancer (Non-melanoma) - Prior known history of multiple skin cancer removed, back in 2013 - R breast area and back. Also strong fam history of skin cancer (again no known melanoma) - Requesting dermatology referral today, prior refer to [facility], [place] [address] uncertain why never scheduled or arranged she did not end up going   Health Maintenance:  Next due Pap Smear 01/2020 - [place] - call to schedule. - your last test was April 2018.  Last mammogram 11/2017 - next due in 11/2019  Depression screen [facility] 2/9 11/27/2018 11/21/2017 12/25/2016    Decreased Interest 0 0 0  Down, Depressed, Hopeless 0 0 0  PHQ - 2 Score 0 0 0  Altered sleeping - 0 -  Tired, decreased energy - 0 -  Change in appetite - 0 -  Feeling bad or failure about yourself  - 0 -  Trouble concentrating - 0 -  Moving slowly or fidgety/restless - 0 -  Suicidal thoughts - 0 -  PHQ-9 Score - 0 -  Difficult doing work/chores - Not difficult at all -   GAD 7 : Generalized Anxiety Score 11/27/2018 11/21/2017 12/25/2016  Nervous, Anxious, on Edge 0 1 0  Control/stop worrying 0 0 0  Worry too much - different things 0 0 0  Trouble relaxing 0 0 0  Restless 0 0 0  Easily annoyed or irritable 0 0 0  Afraid - awful might happen 0 0 0  Total GAD 7 Score 0 1 0  Anxiety Difficulty Not difficult at all Not difficult at all Not difficult at all     Past Medical History:  Diagnosis Date  . Panic attacks    Past Surgical History:  Procedure Laterality Date  . APPENDECTOMY     Social History   Socioeconomic History  . Marital status: Married    Spouse name: Not on file  . Number of children: Not on file  . Years of education: Not on file  . Highest education level: Not on file  Occupational History  . Not  on file  Social Needs  . Financial resource strain: Not on file  . Food insecurity:    Worry: Not on file    Inability: Not on file  . Transportation needs:    Medical: Not on file    Non-medical: Not on file  Tobacco Use  . Smoking status: Never Smoker  . Smokeless tobacco: Never Used  Substance and Sexual Activity  . Alcohol use: No  . Drug use: No  . Sexual activity: Yes  Lifestyle  . Physical activity:    Days per week: Not on file    Minutes per session: Not on file  . Stress: Not on file  Relationships  . Social connections:    Talks on phone: Not on file    Gets together: Not on file    Attends religious service: Not on file    Active member of club or organization: Not on file    Attends meetings of clubs or organizations: Not on  file    Relationship status: Not on file  . Intimate partner violence:    Fear of current or ex partner: Not on file    Emotionally abused: Not on file    Physically abused: Not on file    Forced sexual activity: Not on file  Other Topics Concern  . Not on file  Social History Narrative  . Not on file   Family History  Problem Relation Age of Onset  . Congestive Heart Failure Mother   . Asthma Mother   . Cancer Father        skin, lymphoma  . Heart disease Father   . Skin cancer Father   . Lymphoma Father   . Breast cancer Neg Hx    No current outpatient medications on file prior to visit.   No current facility-administered medications on file prior to visit.     Review of Systems  Constitutional: Negative for activity change, appetite change, chills, diaphoresis, fatigue and fever.  HENT: Negative for congestion and hearing loss.   Eyes: Negative for visual disturbance.  Respiratory: Negative for apnea, cough, choking, chest tightness, shortness of breath and wheezing.   Cardiovascular: Negative for chest pain, palpitations and leg swelling.  Gastrointestinal: Negative for abdominal pain, anal bleeding, blood in stool, constipation, diarrhea, nausea and vomiting.  Endocrine: Negative for cold intolerance.  Genitourinary: Negative for difficulty urinating, dysuria, frequency and hematuria.  Musculoskeletal: Positive for arthralgias (R forearm). Negative for back pain and neck pain.  Skin: Negative for rash.  Allergic/Immunologic: Negative for environmental allergies.  Neurological: Negative for dizziness, weakness, light-headedness, numbness and headaches.  Hematological: Negative for adenopathy.  Psychiatric/Behavioral: Negative for behavioral problems, dysphoric mood and sleep disturbance. The patient is not nervous/anxious.    Per HPI unless specifically indicated above     Objective:    BP 102/75   Pulse 66   Temp 99.4 F (37.4 C) (Oral)   Resp 16   Ht 5' 1"   (1.549 m)   Wt 124 lb (56.2 kg)   BMI 23.43 kg/m   Wt Readings from Last 3 Encounters:  11/27/18 124 lb (56.2 kg)  11/21/17 122 lb (55.3 kg)  08/13/17 129 lb (58.5 kg)    Physical Exam Vitals signs and nursing note reviewed.  Constitutional:      General: She is not in acute distress.    Appearance: She is well-developed. She is not diaphoretic.     Comments: Well-appearing, comfortable, cooperative  HENT:     Head:  Normocephalic and atraumatic.     Comments: Frontal / maxillary sinuses non-tender. Nares patient without congestion. Bilateral TMs clear without erythema, effusion or bulging. Oropharynx clear without erythema, exudates, edema or asymmetry. Eyes:     General:        Right eye: No discharge.        Left eye: No discharge.     Conjunctiva/sclera: Conjunctivae normal.     Pupils: Pupils are equal, round, and reactive to light.  Neck:     Musculoskeletal: Normal range of motion and neck supple.     Thyroid: No thyromegaly.  Cardiovascular:     Rate and Rhythm: Normal rate and regular rhythm.     Heart sounds: Normal heart sounds. No murmur.  Pulmonary:     Effort: Pulmonary effort is normal. No respiratory distress.     Breath sounds: Normal breath sounds. No wheezing or rales.  Abdominal:     General: Bowel sounds are normal. There is no distension.     Palpations: Abdomen is soft. There is no mass.     Tenderness: There is no abdominal tenderness.  Musculoskeletal: Normal range of motion.        General: Tenderness (localized R forearm muscle belly, no olecranon tenderness) present.     Comments: Upper / Lower Extremities: - Normal muscle tone, strength bilateral upper extremities 5/5, lower extremities 5/5  Right upper extremity Mild discomfort tender over muscle belly and lateral epicondyle region, no erythema edema, full active ROM of shoulder and elbow, some discomfort with pronation resisted and wrist extension  Lymphadenopathy:     Cervical: No cervical  adenopathy.  Skin:    General: Skin is warm and dry.     Findings: No erythema or rash.  Neurological:     Mental Status: She is alert and oriented to person, place, and time.     Comments: Distal sensation intact to light touch all extremities  Psychiatric:        Behavior: Behavior normal.     Comments: Well groomed, good eye contact, normal speech and thoughts       Pap IG, HPV[facility]hr - [facility]: 01/23/2017 5:40 AM [place] Component Name Value Ref Range  DIAGNOSIS: - [facility] Comment  Comment: NEGATIVE FOR INTRAEPITHELIAL LESION AND MALIGNANCY.   Specimen adequacy: - [facility] Comment  Comment: Satisfactory for evaluation. Endocervical and/or squamous metaplastic cells (endocervical component) are present.   Clinician provided ICD10: - [facility] Comment  Comment: Z12.4   PERFORMED BY: - [facility] Comment  Comment: Letta Median, Cytotechnologist (ASCP)   . - [facility] .   Note: - [facility] Comment  Comment: The Pap smear is a screening test designed to aid in the detection of premalignant and malignant conditions of the uterine cervix. It is not a diagnostic procedure and should not be used as the sole means of detecting cervical cancer. Both false-positive and false-negative reports do occur.   Test Methodology: - [facility] Comment  Comment: This liquid based ThinPrep(R) pap test was screened with the use of an image guided system.   HPV, high-risk - [facility] Negative  Comment: This high-risk HPV test detects thirteen high-risk types (16/18/31/33/35/39/45/51/52/56/58/59/68) without differentiation. Negative   Specimen Collected on  Genital - Cervix uteri structure (body structure) 01/21/2017 11:49 AM  Result Narrative  Performed at: [facility] [address], [place], [place] [id number] Lab Director: Lindon Romp MD, Phone: [phone] Performed at: [facility] [address] [place], [place] [id number] Lab  Director: Lindon Romp MD, Phone: [phone] Specimen Comment: Source............Marland KitchenCervix Specimen Comment: No. of containers.Marland Kitchen01 ThinPrep Vial   I have personally reviewed the radiology report from mammogram 11/21/17  CLINICAL DATA:  Screening.  EXAM: DIGITAL SCREENING BILATERAL MAMMOGRAM WITH CAD  COMPARISON:  Previous exam(s).  ACR Breast Density Category c: The breast tissue is heterogeneously dense, which may obscure small masses.  FINDINGS: There are no findings suspicious for malignancy. Images were processed with CAD.  IMPRESSION: No mammographic evidence of malignancy. A result letter of this screening mammogram will be mailed directly to the patient.  RECOMMENDATION: Screening mammogram in one year. (Code:SM-B-01Y)  BI-RADS CATEGORY  1: Negative.   Electronically Signed   By: Marin Olp M.D.   On: 11/21/2017 15:54   Results for orders placed or performed in visit on 11/20/18  CBC with Differential/Platelet  Result Value Ref Range   WBC 3.9 3.8 - 10.8 Thousand/uL   RBC 3.99 3.80 - 5.10 Million/uL   Hemoglobin 12.5 11.7 - 15.5 g/dL   HCT 36.8 35.0 - 45.0 %   MCV 92.2 80.0 - 100.0 fL   MCH 31.3 27.0 - 33.0 pg   MCHC 34.0 32.0 - 36.0 g/dL   RDW 12.3 11.0 - 15.0 %   Platelets 165 140 - 400 Thousand/uL   MPV 13.1 (H) 7.5 - 12.5 fL   Neutro Abs 2,235 1,500 - 7,800 cells/uL   Lymphs Abs 1,178 850 - 3,900 cells/uL   Absolute Monocytes 374 200 - 950 cells/uL   Eosinophils Absolute 82 15 - 500 cells/uL   Basophils Absolute 31 0 - 200 cells/uL   Neutrophils Relative % 57.3 %   Total Lymphocyte 30.2 %   Monocytes Relative 9.6 %   Eosinophils Relative 2.1 %   Basophils Relative 0.8 %  Hemoglobin A1c  Result Value Ref Range   Hgb A1c MFr Bld 5.4 <5.7 % of total Hgb   Mean Plasma Glucose 108 (calc)   eAG (mmol/L) 6.0 (calc)  Lipid panel  Result Value Ref Range   Cholesterol 176 <200 mg/dL   HDL 63 > OR = 50 mg/dL   Triglycerides 38 <150  mg/dL   LDL Cholesterol (Calc) 102 (H) mg/dL (calc)   Total CHOL/HDL Ratio 2.8 <5.0 (calc)   Non-HDL Cholesterol (Calc) 113 <130 mg/dL (calc)  COMPLETE METABOLIC PANEL WITH GFR  Result Value Ref Range   Glucose, Bld 84 65 - 99 mg/dL   BUN 10 7 - 25 mg/dL   Creat 0.55 0.50 - 1.10 mg/dL   GFR, Est Non African American 114 > OR = 60 mL/min/1.21m   GFR, Est African American 132 > OR = 60 mL/min/1.75m  BUN/Creatinine Ratio NOT APPLICABLE 6 - 22 (calc)   Sodium 137 135 - 146 mmol/L   Potassium 4.5 3.5 - 5.3 mmol/L   Chloride 107 98 - 110 mmol/L   CO2 25 20 - 32 mmol/L   Calcium 9.0 8.6 - 10.2 mg/dL   Total Protein 6.4 6.1 - 8.1 g/dL   Albumin 4.0 3.6 - 5.1 g/dL   Globulin 2.4 1.9 - 3.7 g/dL (calc)   AG Ratio 1.7 1.0 - 2.5 (calc)   Total Bilirubin 0.4 0.2 - 1.2 mg/dL   Alkaline phosphatase (APISO) 34 31 - 125 U/L   AST 14 10 - 30 U/L   ALT 8 6 - 29 U/L      Assessment & Plan:   Problem List Items Addressed This Visit    Anxiety disorder due to  general medical condition with panic attack    Stable, chronic anxiety >20 years with infrequent panic attacks, without other complications Not consistent with GAD, or associated mood disorder. Well functioning. -GAD7: 0 / PHQ9: 0 Controlled on Celexa Med history: prior on Klonopin, Wellbutrin Not followed by Psychiatry / Psychology  Plan: 1. Continue current med - Celexa 16m tabs (refilled today) - sometimes takes half dose instead 2. Continue self management of rare panic attacks - breathing technique, no additional treatment needed 3. Consider psychology / therapy in future if needed 4. Follow-up 1 year for Annual Physical, GAD7/PHQ9       Relevant Medications   citalopram (CELEXA) 20 MG tablet   History of skin cancer    Reported 2 abnormal skin lesions, biopsy/removed from R breast / trunk - diagnosed with skin cancer, unsure what type (approx 2013 - no known history of melanoma) - Fam history of skin cancer  - Referral to  Dermatology = [place]for surveillance preventative      Relevant Orders   Ambulatory referral to Dermatology   Primary osteoarthritis involving multiple joints    Stable, multiple joint mild arthritic symptoms Limited therapy needed Follow-up PRN       Other Visit Diagnoses    Annual physical exam    -  Primary  Updated Health Maintenance information - Next pap smear per GYN in 01/2020 - Next mammogram 11/2019 - she will do every other year until age [age]Reviewed recent lab results with patient Encouraged improvement to lifestyle with diet and exercise    Lateral epicondylitis of right elbow          Clinically R forearm tendonitis, more specifically lateral epicondylitis given location and provoking factors on exam - Likely acute on chronic flare due to repetitive daily activity.  - Unlikely to be acute muscle tear or fracture  Plan - Reviewed precautions with diagnosis and goal to allow it to heal / modify activity - Start OTC Aleve vs Naproxen NSAID 2565m1-2 per dose BID wc x 1-2 weeks, then PRN - Take Tylenol up to 1g TID PRN breakthrough - Recommend RICE therapy, emphasis on Forearm Strap to help support the muscle limit repetitive strain, may use ACE wrap for compression Follow-up  Meds ordered this encounter  Medications  . citalopram (CELEXA) 20 MG tablet    Sig: Take 0.5-1 tablets (10-20 mg total) by mouth daily.    Dispense:  30 tablet    Refill:  11    Please keep refills on file, for when patient is ready   Orders Placed This Encounter  Procedures  . Ambulatory referral to Dermatology    Referral Priority:   Routine    Referral Type:   Consultation    Referral Reason:   Specialty Services Required    Referred to Provider:   StBrendolyn PattyMD    Requested Specialty:   Dermatology    Number of Visits Requested:   1    Follow up plan: Return in about 1 year (around 11/28/2019) for Annual Physical.  AlNobie PutnamDO [facility] 11/27/2018, 3:29 PM

## 2019-01-10 ENCOUNTER — Other Ambulatory Visit: Payer: Self-pay | Admitting: Family Medicine

## 2019-01-10 DIAGNOSIS — F410 Panic disorder [episodic paroxysmal anxiety] without agoraphobia: Principal | ICD-10-CM

## 2019-01-10 DIAGNOSIS — F064 Anxiety disorder due to known physiological condition: Secondary | ICD-10-CM

## 2019-02-23 ENCOUNTER — Telehealth: Payer: Self-pay

## 2019-02-23 NOTE — Telephone Encounter (Signed)
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 The pt works on 3rd shift and is concern that maybe she may have come in contact with something that the lady touched and would like to get tested for coronavirus. She is asymptomatic and admits that she doesn't work at the same station as the positive patient, but she do touch some of the same parts. I informed the patient that we could schedule her for a acute visit to screen and determine if testing is necessary, but base of what she is telling me no direct contact or symptoms doesn't qualify her for testing. I advise her to monitor for symptoms and limit her exposure with others.

## 2019-02-23 NOTE — Telephone Encounter (Signed)
For this patient, she is an asymptomatic patient. We would be considering antibody testing only for her at this time unless she became symptomatic - then we would follow symptom triage steps towards possible acute testing.  There are still a lot of question marks with antibody testing.  Some general recommendations would be she could be tested about 1-2 weeks after she came in contact with possible coronavirus as long as she is still ASYMPTOMATIC.  We can order through [facility] here or through [facility], blood draw to check for antibody to tell her if she was exposed or not.  This is not a guaranteed result - if it is NEGATIVE, that means less likely to have been exposed but it does not confirm it.  If it is POSITIVE then that means she has come in contact with it - and may not be sick or may not be contagious but her body has developed some antibody, and we are not convinced that this provides immunity however - so she should STILL follow all precautions.  Nobie Putnam, DO [facility] 02/23/2019, 12:18 PM

## 2019-02-24 NOTE — Telephone Encounter (Signed)
Attempted to contact the pt to notify her of Dr. Raliegh Ip recommendation.

## 2019-02-24 NOTE — Telephone Encounter (Signed)
The pt was notified. No questions or concerns. 

## 2019-07-05 ENCOUNTER — Ambulatory Visit (INDEPENDENT_AMBULATORY_CARE_PROVIDER_SITE_OTHER): Payer: 59 | Admitting: Nurse Practitioner

## 2019-07-05 ENCOUNTER — Encounter: Payer: Self-pay | Admitting: Nurse Practitioner

## 2019-07-05 ENCOUNTER — Other Ambulatory Visit: Payer: Self-pay

## 2019-07-05 VITALS — BP 97/68 | HR 74 | Ht 61.0 in | Wt 123.0 lb

## 2019-07-05 DIAGNOSIS — M67441 Ganglion, right hand: Secondary | ICD-10-CM | POA: Diagnosis not present

## 2019-07-05 MED ORDER — PREDNISONE 10 MG PO TABS: ORAL_TABLET | ORAL | 0 refills | Status: DC

## 2019-07-05 NOTE — Progress Notes (Signed)
Subjective:    Patient ID: Sheila Clarke, female    DOB: 20-Jan-1976, 43 y.o.   MRN: [id number]  Sheila Clarke is a 43 y.o. female presenting on 07/05/2019 for Hand Problem (knot in the palm of the right x 2 days )   HPI Hand lump Patient notes onset of nodule/lump on palm of hand at fourth finger RIGHT hand.  Mildly tender.  Became itchy and that is why patient noted.  No recall of presence of lesion prior to 2 days ago.   Patient reports no difficulties with finger/hand mobility.  No treatment at home to date.  Social History   Tobacco Use  . Smoking status: Never Smoker  . Smokeless tobacco: Never Used  Substance Use Topics  . Alcohol use: No  . Drug use: No    Review of Systems Per HPI unless specifically indicated above     Objective:    BP 97/68 (BP Location: Left Arm, Patient Position: Sitting, Cuff Size: Small)   Pulse 74   Ht 5' 1"  (1.549 m)   Wt 123 lb (55.8 kg)   BMI 23.24 kg/m   Wt Readings from Last 3 Encounters:  07/05/19 123 lb (55.8 kg)  11/27/18 124 lb (56.2 kg)  11/21/17 122 lb (55.3 kg)    Physical Exam Vitals signs reviewed.  Constitutional:      General: She is not in acute distress.    Appearance: She is well-developed.  HENT:     Head: Normocephalic and atraumatic.  Cardiovascular:     Rate and Rhythm: Normal rate and regular rhythm.     Pulses: Normal pulses.     Heart sounds: Normal heart sounds.  Pulmonary:     Effort: Pulmonary effort is normal.     Breath sounds: Normal breath sounds.  Musculoskeletal:     Comments: Flexor tendon at 4th finger Right hand has hard nodule moving with finger flexion  Skin:    General: Skin is warm and dry.  Neurological:     Mental Status: She is alert and oriented to person, place, and time.  Psychiatric:        Behavior: Behavior normal.    Results for orders placed or performed in visit on 11/27/18  HM PAP SMEAR  Result Value Ref Range   HM Pap smear Negative cytology, negative high  risk HPV       Assessment & Plan:   Problem List Items Addressed This Visit    None    Visit Diagnoses    Ganglion cyst of flexor tendon sheath of finger of right hand    -  Primary   Relevant Medications   predniSONE (DELTASONE) 10 MG tablet    Stable ganglion cyst.  No current complications.  Patient desires treatment options today as it is now itchy and becoming bothersome.  No trigger finger to date.  Plan: 1. Oral 6 day course prednisone.  2. Consider referral orthopedics for Dr. Peggye Ley in future if not improving/resolving. 3. Follow-up 2-4 weeks prn.  Meds ordered this encounter  Medications  . predniSONE (DELTASONE) 10 MG tablet    Sig: Day 1 take 6 pills. Day 2 take 5 pills then reduce by 1 pill each day.    Dispense:  21 tablet    Refill:  0    Order Specific Question:   Supervising Provider    Answer:   Olin Hauser [[id number]]    Follow up plan: 2-4 weeks prn  Cassell Smiles, DNP,  AGPCNP-BC Adult Gerontology Primary Care Nurse Practitioner [facility] 07/05/2019, 9:11 AM

## 2019-07-05 NOTE — Patient Instructions (Addendum)
Sheila Clarke,   Thank you for coming in to clinic today.  1. You have a ganglion cyst - - Start prednisone 6 day taper. Take prednisone taper 10 mg tablets Day 1 (Today): Take 6 pills at one time Day 2: Take 5 pills Day 3: Take 4 pills Day 4: Take 3 pills Day 5: Take 2 pills Day 6: Take 1 pill then stop.   - AFTER prednisone, may use naproxen 220-440 mg one tab twice daily for 7-14 days if flares occur.  - May need referral to hand specialist if cyst continues to grow or impacts hand movement.  Call for referral at any time in next 4 weeks.  Please schedule a follow-up appointment with Cassell Smiles, AGNP. Follow-up as needed 2-4 weeks  If you have any other questions or concerns, please feel free to call the clinic or send a message through [place]. You may also schedule an earlier appointment if necessary.  You will receive a survey after today's visit either digitally by e-mail or paper by [employer]. Your experiences and feedback matter to [place].  Please respond so we know how we are doing as we provide care for you.   Cassell Smiles, DNP, AGNP-BC Adult Gerontology Nurse Practitioner [facility], [facility]   Ganglion Cyst  A ganglion cyst is a non-cancerous, fluid-filled lump that occurs near a joint or tendon. The cyst grows out of a joint or the lining of a tendon. Ganglion cysts most often develop in the hand or wrist, but they can also develop in the shoulder, elbow, hip, knee, ankle, or foot. Ganglion cysts are ball-shaped or egg-shaped. Their size can range from the size of a pea to larger than a grape. Increased activity may cause the cyst to get bigger because more fluid starts to build up. What are the causes? The exact cause of this condition is not known, but it may be related to:  Inflammation or irritation around the joint.  An injury.  Repetitive movements or overuse.  Arthritis. What increases the risk? You are more likely to develop this  condition if:  You are a woman.  You are 49-88 years old. What are the signs or symptoms? The main symptom of this condition is a lump. It most often appears on the hand or wrist. In many cases, there are no other symptoms, but a cyst can sometimes cause:  Tingling.  Pain.  Numbness.  Muscle weakness.  Weak grip.  Less range of motion in a joint. How is this diagnosed? Ganglion cysts are usually diagnosed based on a physical exam. Your health care provider will feel the lump and may shine a light next to it. If it is a ganglion cyst, the light will likely shine through it. Your health care provider may order an X-ray, ultrasound, or MRI to rule out other conditions. How is this treated? Ganglion cysts often go away on their own without treatment. If you have pain or other symptoms, treatment may be needed. Treatment is also needed if the ganglion cyst limits your movement or if it gets infected. Treatment may include:  Wearing a brace or splint on your wrist or finger.  Taking anti-inflammatory medicine.  Having fluid drained from the lump with a needle (aspiration).  Getting a steroid injected into the joint.  Having surgery to remove the ganglion cyst.  Placing a pad on your shoe or wearing shoes that will not rub against the cyst if it is on your foot.  Follow these instructions at home:  Do not press on the ganglion cyst, poke it with a needle, or hit it.  Take over-the-counter and prescription medicines only as told by your health care provider.  If you have a brace or splint: ? Wear it as told by your health care provider. ? Remove it as told by your health care provider. Ask if you need to remove it when you take a shower or a bath.  Watch your ganglion cyst for any changes.  Keep all follow-up visits as told by your health care provider. This is important. Contact a health care provider if:  Your ganglion cyst becomes larger or more painful.  You have pus  coming from the lump.  You have weakness or numbness in the affected area.  You have a fever or chills. Get help right away if:  You have a fever and have any of these in the cyst area: ? Increased redness. ? Red streaks. ? Swelling. Summary  A ganglion cyst is a non-cancerous, fluid-filled lump that occurs near a joint or tendon.  Ganglion cysts most often develop in the hand or wrist, but they can also develop in the shoulder, elbow, hip, knee, ankle, or foot.  Ganglion cysts often go away on their own without treatment. This information is not intended to replace advice given to you by your health care provider. Make sure you discuss any questions you have with your health care provider. Document Released: 09/20/2000 Document Revised: 09/05/2017 Document Reviewed: 05/23/2017 Elsevier Patient Education  2020 [employer].

## 2019-07-07 ENCOUNTER — Encounter: Payer: Self-pay | Admitting: Nurse Practitioner

## 2019-07-12 ENCOUNTER — Telehealth: Payer: Self-pay | Admitting: Family Medicine

## 2019-07-12 DIAGNOSIS — M67441 Ganglion, right hand: Secondary | ICD-10-CM

## 2019-07-12 NOTE — Telephone Encounter (Signed)
The pt was notified and she verbalize understanding.

## 2019-07-12 NOTE — Telephone Encounter (Signed)
Referral has been sent to [facility].

## 2019-07-12 NOTE — Telephone Encounter (Signed)
Pt.said that her hand was not better an requesting a referral

## 2019-11-26 ENCOUNTER — Other Ambulatory Visit: Payer: Self-pay

## 2019-12-01 ENCOUNTER — Encounter: Payer: Self-pay | Admitting: Family Medicine

## 2019-12-10 ENCOUNTER — Telehealth: Payer: Self-pay | Admitting: Family Medicine

## 2019-12-10 DIAGNOSIS — Z0000 Encounter for general adult medical examination without abnormal findings: Secondary | ICD-10-CM

## 2019-12-10 NOTE — Telephone Encounter (Signed)
Labs ordered  Nobie Putnam, [facility] 12/10/2019, 12:12 PM

## 2019-12-13 ENCOUNTER — Other Ambulatory Visit: Payer: 59

## 2019-12-13 ENCOUNTER — Other Ambulatory Visit: Payer: Self-pay

## 2019-12-13 DIAGNOSIS — Z0000 Encounter for general adult medical examination without abnormal findings: Secondary | ICD-10-CM

## 2019-12-14 LAB — CBC WITH DIFFERENTIAL/PLATELET
Absolute Monocytes: 368 cells/uL (ref 200–950)
Basophils Absolute: 20 cells/uL (ref 0–200)
Basophils Relative: 0.5 %
Eosinophils Absolute: 100 cells/uL (ref 15–500)
Eosinophils Relative: 2.5 %
HCT: 37.4 % (ref 35.0–45.0)
Hemoglobin: 12.2 g/dL (ref 11.7–15.5)
Lymphs Abs: 1256 cells/uL (ref 850–3900)
MCH: 30.7 pg (ref 27.0–33.0)
MCHC: 32.6 g/dL (ref 32.0–36.0)
MCV: 94.2 fL (ref 80.0–100.0)
MPV: 12.5 fL (ref 7.5–12.5)
Monocytes Relative: 9.2 %
Neutro Abs: 2256 cells/uL (ref 1500–7800)
Neutrophils Relative %: 56.4 %
Platelets: 163 10*3/uL (ref 140–400)
RBC: 3.97 10*6/uL (ref 3.80–5.10)
RDW: 11.8 % (ref 11.0–15.0)
Total Lymphocyte: 31.4 %
WBC: 4 10*3/uL (ref 3.8–10.8)

## 2019-12-14 LAB — COMPLETE METABOLIC PANEL WITH GFR
AG Ratio: 1.7 (calc) (ref 1.0–2.5)
ALT: 9 U/L (ref 6–29)
AST: 13 U/L (ref 10–35)
Albumin: 4 g/dL (ref 3.6–5.1)
Alkaline phosphatase (APISO): 36 U/L (ref 31–125)
BUN: 9 mg/dL (ref 7–25)
CO2: 29 mmol/L (ref 20–32)
Calcium: 9.3 mg/dL (ref 8.6–10.2)
Chloride: 107 mmol/L (ref 98–110)
Creat: 0.62 mg/dL (ref 0.50–1.10)
GFR, Est African American: 126 mL/min/{1.73_m2} (ref >=60)
GFR, Est Non African American: 109 mL/min/{1.73_m2} (ref >=60)
Globulin: 2.4 g/dL (calc) (ref 1.9–3.7)
Glucose, Bld: 86 mg/dL (ref 65–99)
Potassium: 4.7 mmol/L (ref 3.5–5.3)
Sodium: 139 mmol/L (ref 135–146)
Total Bilirubin: 0.5 mg/dL (ref 0.2–1.2)
Total Protein: 6.4 g/dL (ref 6.1–8.1)

## 2019-12-14 LAB — LIPID PANEL
Cholesterol: 155 mg/dL (ref <200)
HDL: 62 mg/dL (ref >=50)
LDL Cholesterol (Calc): 82 mg/dL (calc)
Non-HDL Cholesterol (Calc): 93 mg/dL (calc) (ref <130)
Total CHOL/HDL Ratio: 2.5 (calc) (ref <5.0)
Triglycerides: 37 mg/dL (ref <150)

## 2019-12-14 LAB — HEMOGLOBIN A1C
Hgb A1c MFr Bld: 5.3 % of total Hgb (ref <5.7)
Mean Plasma Glucose: 105 (calc)
eAG (mmol/L): 5.8 (calc)

## 2019-12-20 ENCOUNTER — Other Ambulatory Visit: Payer: Self-pay

## 2019-12-20 ENCOUNTER — Ambulatory Visit (INDEPENDENT_AMBULATORY_CARE_PROVIDER_SITE_OTHER): Payer: 59 | Admitting: Family Medicine

## 2019-12-20 ENCOUNTER — Encounter: Payer: Self-pay | Admitting: Family Medicine

## 2019-12-20 VITALS — BP 98/64 | HR 69 | Temp 97.7°F | Resp 16 | Ht 61.0 in | Wt 125.0 lb

## 2019-12-20 DIAGNOSIS — M720 Palmar fascial fibromatosis [Dupuytren]: Secondary | ICD-10-CM

## 2019-12-20 DIAGNOSIS — M7711 Lateral epicondylitis, right elbow: Secondary | ICD-10-CM | POA: Diagnosis not present

## 2019-12-20 DIAGNOSIS — Z0000 Encounter for general adult medical examination without abnormal findings: Secondary | ICD-10-CM

## 2019-12-20 MED ORDER — PREDNISONE 10 MG PO TABS: ORAL_TABLET | ORAL | 0 refills | Status: DC

## 2019-12-20 NOTE — Patient Instructions (Addendum)
Thank you for coming to the office today.  Start Prednisone taper over 6 days, can hold off Ibuprofen/Aleve/Advil/Motrin while on prednisone.  Recommend to start taking Tylenol Extra Strength 55m tabs - take 1 to 2 tabs per dose (max 10068m every 6-8 hours for pain (take regularly, don't skip a dose for next 7 days), max 24 hour daily dose is 6 tablets or 300063mIn the future you can repeat the same everyday Tylenol course for 1-2 weeks at a time.   Check into these options for Hand upper extremity specialist and let me know where you would like to go.  [facility] (GrLady GaryilSatira AnisraAmedeo [address]enty[address], Sheila [address] [phone]-----------------------------------  [facility]) Address: [address] [id number]:  9AM-5PM Phone: [phone] AleLeretha Pol------------------------------------  You most likely have "Tennis Elbow" or "Lateral Epicondylitis" - This is inflammation or tendonitis affecting the muscles of your forearm - Usually it is caused by frequent or daily repetitive activities using your arms, lifting, rotating, pulling, twisting - it can happen over days to months or longer from repetitive strain  One of the most important parts of treatment is rest and avoiding the activities that make it worse.  Recommend trial of Anti-inflammatory with Naproxen (Naprosyn) 500m66mbs - take one with food and plenty of water TWICE daily every day (breakfast and dinner), for next 2 to 4 weeks, then you may take only as needed - DO NOT TAKE any ibuprofen, aleve, motrin while you are taking this medicine - It is safe to take Tylenol Ext Str 500mg26ms - take 1 to 2 (max dose 1000mg)5mry 6 hours as needed for breakthrough pain, max 24 hour daily dose is 6 to 8 tablets or 4000mg  69mRICE therapy: - R - Rest / relative rest with activity modification avoid overuse of joint - I - Ice packs (make  sure you use a towel or sock / something to protect skin) - C - Compression with ACE wrap to apply pressure and reduce swelling allowing more support  Try a Forearm Tennis Elbow Strap over muscle as demonstrated - use with repetitive activity to reduce strain of muscle    - E - Elevation - if significant swelling, lift leg above heart level (toes above your nose) to help reduce swelling, most helpful at night after day of being on your feet  May consider Physical Therapy referral if interested    Please schedule a Follow-up Appointment to: Return in about 3 months (around 03/21/2020) for 3 month follow-up Right arm/hand.  If you have any other questions or concerns, please feel free to call the office or send a message through [facility] also schedule an earlier appointment if necessary.  Additionally, you may be receiving a survey about your experience at our office within a few days to 1 week by e-mail or mail. We value your feedback.  Sheila Clarke

## 2019-12-20 NOTE — Progress Notes (Signed)
Subjective:    Patient ID: Sheila Clarke, female    DOB: June 12, 1976, 44 y.o.   MRN: [id number]  Sheila Clarke is a 44 y.o. female presenting on 12/20/2019 for Annual Exam and dupuytren's contracture (diagnosed last year 07/23/2019 ---Right arm pain--throbbing dull achy pain onset 2 weeks)   HPI   Annual Physical and Lab Review. - Lab results available. No significant abnormality. - She is doing well with general health - She is active at work, limited other exercise regimen - Eats balanced diet    Additional concern today  Right upper extremity pain / Arthritis / Dupuytren's Contracture Right handed Previous dx w/ Dupuytren's Contracture Dr Sabra Heck 07/2019 Prior flare in 11/2018 w Right Lateral Epicondylitis improved with Naprosyn and also with wrist brace splint. It did improve at that time. 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 Worse pain more constant for past week, she has been working 6 days a week, works 300% productivity fastest there. - She has been out of work since Thursday. She describes some pain at rest but worse with activity - She does not feel she can return to work at current job and asking about possibly doctors note to change positions within the company, they have asked her to discuss this with me today - Admits occasional joint swelling in R hand Admits some weakness in R hand grip over time has worsened - Denies any acute injury, ecchymosis, persistent numbness tingling  Health Maintenance:  Last pap smear done negative 01/2017 [facility]. Next due in 01/2020  Depression screen [facility] 2/9 12/20/2019 11/27/2018 11/21/2017  Decreased Interest 0 0 0  Down, Depressed, Hopeless 0 0 0  PHQ - 2 Score 0 0 0  Altered sleeping - - 0  Tired, decreased energy - - 0  Change in appetite - - 0  Feeling bad or failure about yourself  - - 0  Trouble concentrating - - 0  Moving slowly or  fidgety/restless - - 0  Suicidal thoughts - - 0  PHQ-9 Score - - 0  Difficult doing work/chores - - Not difficult at all   GAD 7 : Generalized Anxiety Score 11/27/2018 11/21/2017 12/25/2016  Nervous, Anxious, on Edge 0 1 0  Control/stop worrying 0 0 0  Worry too much - different things 0 0 0  Trouble relaxing 0 0 0  Restless 0 0 0  Easily annoyed or irritable 0 0 0  Afraid - awful might happen 0 0 0  Total GAD 7 Score 0 1 0  Anxiety Difficulty Not difficult at all Not difficult at all Not difficult at all     Past Medical History:  Diagnosis Date  . Panic attacks    Past Surgical History:  Procedure Laterality Date  . APPENDECTOMY     Social History   Socioeconomic History  . Marital status: Married    Spouse name: Not on file  . Number of children: Not on file  . Years of education: Not on file  . Highest education level: Not on file  Occupational History  . Not on file  Tobacco Use  . Smoking status: Never Smoker  . Smokeless tobacco: Never Used  Substance and Sexual Activity  . Alcohol use: No  . Drug use: No  . Sexual activity: Yes  Other Topics Concern  . Not on file  Social History Narrative  . Not on file   Social Determinants of  Health   Financial Resource Strain:   . Difficulty of Paying Living Expenses:   Food Insecurity:   . Worried About Charity fundraiser in the Last Year:   . Arboriculturist in the Last Year:   Transportation Needs:   . Film/video editor (Medical):   Marland Kitchen Lack of Transportation (Non-Medical):   Physical Activity:   . Days of Exercise per Week:   . Minutes of Exercise per Session:   Stress:   . Feeling of Stress :   Social Connections:   . Frequency of Communication with Friends and Family:   . Frequency of Social Gatherings with Friends and Family:   . Attends Religious Services:   . Active Member of Clubs or Organizations:   . Attends Archivist Meetings:   Marland Kitchen Marital Status:   Intimate Partner Violence:     . Fear of Current or Ex-Partner:   . Emotionally Abused:   Marland Kitchen Physically Abused:   . Sexually Abused:    Family History  Problem Relation Age of Onset  . Congestive Heart Failure Mother   . Asthma Mother   . Cancer Father        skin, lymphoma  . Heart disease Father   . Skin cancer Father   . Lymphoma Father   . Breast cancer Neg Hx    Current Outpatient Medications on File Prior to Visit  Medication Sig  . citalopram (CELEXA) 20 MG tablet TAKE 0.5-1 TABLETS (10-20 MG TOTAL) BY MOUTH DAILY.   No current facility-administered medications on file prior to visit.    Review of Systems  Constitutional: Negative for activity change, appetite change, chills, diaphoresis, fatigue and fever.  HENT: Negative for congestion and hearing loss.   Eyes: Negative for visual disturbance.  Respiratory: Negative for apnea, cough, chest tightness, shortness of breath and wheezing.   Cardiovascular: Negative for chest pain, palpitations and leg swelling.  Gastrointestinal: Negative for abdominal pain, constipation, diarrhea, nausea and vomiting.  Endocrine: Negative for cold intolerance.  Genitourinary: Negative for difficulty urinating, dysuria, frequency and hematuria.  Musculoskeletal: Positive for arthralgias. Negative for back pain and neck pain.  Skin: Negative for rash.  Allergic/Immunologic: Negative for environmental allergies.  Neurological: Negative for dizziness, weakness, light-headedness, numbness and headaches.  Hematological: Negative for adenopathy.  Psychiatric/Behavioral: Negative for behavioral problems, dysphoric mood and sleep disturbance.   Per HPI unless specifically indicated above       Objective:    BP 98/64   Pulse 69   Temp 97.7 F (36.5 C) (Temporal)   Resp 16   Ht 5' 1"  (1.549 m)   Wt 125 lb (56.7 kg)   BMI 23.62 kg/m   Wt Readings from Last 3 Encounters:  12/20/19 125 lb (56.7 kg)  07/05/19 123 lb (55.8 kg)  11/27/18 124 lb (56.2 kg)     Physical Exam Vitals and nursing note reviewed.  Constitutional:      General: She is not in acute distress.    Appearance: She is well-developed. She is not diaphoretic.     Comments: Well-appearing, comfortable, cooperative  HENT:     Head: Normocephalic and atraumatic.  Eyes:     General:        Right eye: No discharge.        Left eye: No discharge.     Conjunctiva/sclera: Conjunctivae normal.     Pupils: Pupils are equal, round, and reactive to light.  Neck:     Thyroid: No thyromegaly.  Cardiovascular:     Rate and Rhythm: Normal rate and regular rhythm.     Heart sounds: Normal heart sounds. No murmur.  Pulmonary:     Effort: Pulmonary effort is normal. No respiratory distress.     Breath sounds: Normal breath sounds. No wheezing or rales.  Abdominal:     General: Bowel sounds are normal. There is no distension.     Palpations: Abdomen is soft. There is no mass.     Tenderness: There is no abdominal tenderness.  Musculoskeletal:        General: No tenderness. Normal range of motion.     Cervical back: Normal range of motion and neck supple.     Comments: Upper / Lower Extremities: - Normal muscle tone, strength bilateral upper extremities 5/5, lower extremities 5/5  Right hand with palmar aspect palpable nodular firm fibrous density along tendon sheath assoc w/ 4th finger within palm  Area of tenderness along R forearm muscle area of lateral epicondyle  Lymphadenopathy:     Cervical: No cervical adenopathy.  Skin:    General: Skin is warm and dry.     Findings: No erythema or rash.  Neurological:     Mental Status: She is alert and oriented to person, place, and time.     Comments: Distal sensation intact to light touch all extremities  Psychiatric:        Behavior: Behavior normal.     Comments: Well groomed, good eye contact, normal speech and thoughts        Results for orders placed or performed in visit on 12/13/19  COMPLETE METABOLIC PANEL WITH GFR   Result Value Ref Range   Glucose, Bld 86 65 - 99 mg/dL   BUN 9 7 - 25 mg/dL   Creat 0.62 0.50 - 1.10 mg/dL   GFR, Est Non African American 109 > OR = 60 mL/min/1.54m   GFR, Est African American 126 > OR = 60 mL/min/1.764m  BUN/Creatinine Ratio NOT APPLICABLE 6 - 22 (calc)   Sodium 139 135 - 146 mmol/L   Potassium 4.7 3.5 - 5.3 mmol/L   Chloride 107 98 - 110 mmol/L   CO2 29 20 - 32 mmol/L   Calcium 9.3 8.6 - 10.2 mg/dL   Total Protein 6.4 6.1 - 8.1 g/dL   Albumin 4.0 3.6 - 5.1 g/dL   Globulin 2.4 1.9 - 3.7 g/dL (calc)   AG Ratio 1.7 1.0 - 2.5 (calc)   Total Bilirubin 0.5 0.2 - 1.2 mg/dL   Alkaline phosphatase (APISO) 36 31 - 125 U/L   AST 13 10 - 35 U/L   ALT 9 6 - 29 U/L  Lipid panel  Result Value Ref Range   Cholesterol 155 <200 mg/dL   HDL 62 > OR = 50 mg/dL   Triglycerides 37 <150 mg/dL   LDL Cholesterol (Calc) 82 mg/dL (calc)   Total CHOL/HDL Ratio 2.5 <5.0 (calc)   Non-HDL Cholesterol (Calc) 93 <130 mg/dL (calc)  Hemoglobin A1c  Result Value Ref Range   Hgb A1c MFr Bld 5.3 <5.7 % of total Hgb   Mean Plasma Glucose 105 (calc)   eAG (mmol/L) 5.8 (calc)  CBC with Differential/Platelet  Result Value Ref Range   WBC 4.0 3.8 - 10.8 Thousand/uL   RBC 3.97 3.80 - 5.10 Million/uL   Hemoglobin 12.2 11.7 - 15.5 g/dL   HCT 37.4 35.0 - 45.0 %   MCV 94.2 80.0 - 100.0 fL   MCH 30.7 27.0 - 33.0 pg  MCHC 32.6 32.0 - 36.0 g/dL   RDW 11.8 11.0 - 15.0 %   Platelets 163 140 - 400 Thousand/uL   MPV 12.5 7.5 - 12.5 fL   Neutro Abs 2,256 1,500 - 7,800 cells/uL   Lymphs Abs 1,256 850 - 3,900 cells/uL   Absolute Monocytes 368 200 - 950 cells/uL   Eosinophils Absolute 100 15 - 500 cells/uL   Basophils Absolute 20 0 - 200 cells/uL   Neutrophils Relative % 56.4 %   Total Lymphocyte 31.4 %   Monocytes Relative 9.2 %   Eosinophils Relative 2.5 %   Basophils Relative 0.5 %      Assessment & Plan:   Problem List Items Addressed This Visit    Right lateral epicondylitis    Relevant Medications   predniSONE (DELTASONE) 10 MG tablet    Other Visit Diagnoses    Annual physical exam    -  Primary   Dupuytren's contracture of right hand       Relevant Medications   predniSONE (DELTASONE) 10 MG tablet     Updated Health Maintenance information Reviewed recent lab results with patient Encouraged improvement to lifestyle with diet and exercise Maintain healthy weight  #Right Upper Extremity Chronic pain / Lateral epicondylitis / dupuytren's Concerning chronic worsening history with repetitive activity at work Discussed options for management today, ultimately we agree that she will need to dramatically adjust her work habits and likely end up changing job within company, seems to be the primary thing flaring up her chronic R arm symptoms - For now, prednisone burst taper over 6 days for symptom relief - Offer OTC NSAID course after prednisone. Tylenol PRN - use splint / brace as needed - New work note written today, out from last week until thurs this week, see note. Also included medical recommendation on work restriction to avoid repetitive activity and she can discuss further job changes with her employer. We can provide any other specific instructions as needed.  Recommended referral to hand/wrist/upper extremity ortho specialist - gave her list of providers for now, to review check insurance and her preference, she can contact me back when ready for referral.     Meds ordered this encounter  Medications  . predniSONE (DELTASONE) 10 MG tablet    Sig: Take 6 tabs with breakfast Day 1, 5 tabs Day 2, 4 tabs Day 3, 3 tabs Day 4, 2 tabs Day 5, 1 tab Day 6.    Dispense:  21 tablet    Refill:  0      Follow up plan: Return in about 3 months (around 03/21/2020) for 3 month follow-up Right arm/hand.  Nobie Putnam, [facility] 12/20/2019, 9:07 AM

## 2019-12-21 ENCOUNTER — Telehealth: Payer: Self-pay | Admitting: Family Medicine

## 2019-12-21 NOTE — Telephone Encounter (Signed)
The general recommendation is to be OFF of the Prednisone when she takes the COVID19 vaccine. At this time, I do not believe there is a clear exact amount of time to be off of it. Some research also suggests it is fine to take the COVID19 vaccine while taking the prednisone.  I would say, wait until finished the prednisone, then can take COVID19 vaccine anytime.  Nobie Putnam, [facility] 12/21/2019, 4:50 PM

## 2019-12-21 NOTE — Telephone Encounter (Signed)
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

## 2019-12-22 NOTE — Telephone Encounter (Signed)
Patient advised.

## 2020-02-14 ENCOUNTER — Ambulatory Visit: Payer: 59 | Admitting: Family Medicine

## 2020-03-11 ENCOUNTER — Other Ambulatory Visit: Payer: Self-pay | Admitting: Family Medicine

## 2020-03-11 DIAGNOSIS — F064 Anxiety disorder due to known physiological condition: Secondary | ICD-10-CM

## 2020-03-11 DIAGNOSIS — F410 Panic disorder [episodic paroxysmal anxiety] without agoraphobia: Secondary | ICD-10-CM

## 2020-03-11 NOTE — Telephone Encounter (Signed)
Requested Prescriptions  Pending Prescriptions Disp Refills  . citalopram (CELEXA) 20 MG tablet [Pharmacy Med Name: CITALOPRAM HBR 20 MG TABLET] 90 tablet 3    Sig: TAKE 0.5-1 TABLETS (10-20 MG TOTAL) BY MOUTH DAILY.     Psychiatry:  Antidepressants - SSRI Failed - 03/11/2020  9:40 AM      Failed - Valid encounter within last 6 months    Recent Outpatient Visits          2 months ago Annual physical exam   [place], DO   8 months ago Ganglion cyst of flexor tendon sheath of finger of right hand   [facility] Merrilyn Puma, Jerrel Ivory, NP   1 year ago Annual physical exam   [facility] Olin Hauser, DO   2 years ago Annual physical exam   [facility] Olin Hauser, DO   2 years ago Pain of right midfoot   [facility] [place], Devonne Doughty, [place]

## 2020-04-17 ENCOUNTER — Other Ambulatory Visit: Payer: Self-pay | Admitting: Orthopaedic Surgery

## 2020-04-17 DIAGNOSIS — M720 Palmar fascial fibromatosis [Dupuytren]: Secondary | ICD-10-CM

## 2020-04-24 ENCOUNTER — Other Ambulatory Visit: Payer: Self-pay

## 2020-04-24 ENCOUNTER — Ambulatory Visit
Admission: RE | Admit: 2020-04-24 | Discharge: 2020-04-24 | Disposition: A | Discharge status: Home / Self Care | Payer: 59 | Source: Ambulatory Visit | Attending: Orthopaedic Surgery | Admitting: Orthopaedic Surgery

## 2020-04-24 DIAGNOSIS — M720 Palmar fascial fibromatosis [Dupuytren]: Secondary | ICD-10-CM | POA: Diagnosis present

## 2020-07-07 ENCOUNTER — Telehealth: Payer: 59 | Admitting: Family Medicine

## 2020-10-11 ENCOUNTER — Ambulatory Visit: Payer: Self-pay | Admitting: *Deleted

## 2020-10-11 NOTE — Telephone Encounter (Signed)
Patient requesting to know when she can go back to work after having covid? Patient is scheduled for retesting 10/21/19. Reviewed updated CDC guidelines for positive patients to isolate - stay home for 5 days, if no symptoms or symptoms resolving after 5 days, you can leave your house. Continue to wear a mask around other for 5 additional days. If you have symptoms or fever, continue to stay ome until you fever is gone. Retesting is not required unless your employer requests so. Care advise given. Patient verbalized understanding of care advise and to call back if needed.   Reason for Disposition . Health Information question, no triage required and triager able to answer question  Answer Assessment - Initial Assessment Questions 1. REASON FOR CALL or QUESTION: "What is your reason for calling today?" or "How can I best help you?" or "What question do you have that I can help answer?"     Requesting when she can go back to work after having covid?  Protocols used: INFORMATION ONLY CALL - NO TRIAGE-A-AH

## 2020-10-12 ENCOUNTER — Other Ambulatory Visit: Payer: 59

## 2020-10-12 DIAGNOSIS — Z20822 Contact with and (suspected) exposure to covid-19: Secondary | ICD-10-CM

## 2020-10-17 LAB — NOVEL CORONAVIRUS, NAA: SARS-CoV-2, NAA: NOT DETECTED

## 2021-02-19 ENCOUNTER — Other Ambulatory Visit: Payer: Self-pay

## 2021-02-19 ENCOUNTER — Encounter: Payer: Self-pay | Admitting: Family Medicine

## 2021-02-19 ENCOUNTER — Ambulatory Visit (INDEPENDENT_AMBULATORY_CARE_PROVIDER_SITE_OTHER): Payer: BC Managed Care – PPO | Admitting: Family Medicine

## 2021-02-19 VITALS — BP 98/73 | HR 65 | Temp 98.0°F | Ht 61.0 in | Wt 131.0 lb

## 2021-02-19 DIAGNOSIS — E7800 Pure hypercholesterolemia, unspecified: Secondary | ICD-10-CM | POA: Diagnosis not present

## 2021-02-19 DIAGNOSIS — N926 Irregular menstruation, unspecified: Secondary | ICD-10-CM | POA: Diagnosis not present

## 2021-02-19 DIAGNOSIS — Z1231 Encounter for screening mammogram for malignant neoplasm of breast: Secondary | ICD-10-CM

## 2021-02-19 DIAGNOSIS — Z0000 Encounter for general adult medical examination without abnormal findings: Secondary | ICD-10-CM | POA: Diagnosis not present

## 2021-02-19 NOTE — Progress Notes (Signed)
Subjective:    Patient ID: Sheila Clarke, female    DOB: June 11, 1976, 45 y.o.   MRN: [id number]  Sheila Clarke is a 45 y.o. female presenting on 02/19/2021 for Annual Exam and Menstrual Problem   HPI   Annual Physical and Lab Orders. - Lab results available. No significant abnormality. - She is doing well with general health - She has new job at work, more on feet walking regularly, seems R foot problematic. No longer repetitive work on R side. - Eats balanced diet   Additional concern today  Updates History of Arthritis, Joint Pain, Dupuytren's Contracture Followed by Orthopedic Dr Sabra Heck - given rx Meloxicam 11m daily, for foot pain inflammation, has had some improvement.  Abnormal Menstrual Bleeding Past 3 months of absent menstrual cycle, then resumed w/ abnormal heavier bleeding, perimenopausal. No fam history of menopause due to hysterectomy  Health Maintenance:  Colon CA Screening: Never had colonoscopy. Currently asymptomatic. No known family history of colon CA. Due for screening test considering Cologuard, counseling given  [facility] will [facility] to schedule   [facility] 02/19/2021 12/20/2019 11/27/2018  Decreased Interest 0 0 0  Down, Depressed, Hopeless 0 0 0  PHQ - 2 Score 0 0 0  Altered sleeping 0 - -  Tired, decreased energy 0 - -  Change in appetite 0 - -  Feeling bad or failure about yourself  0 - -  Trouble concentrating 0 - -  Moving slowly or fidgety/restless 0 - -  Suicidal thoughts 0 - -  PHQ-9 Score 0 - -  Difficult doing work/chores Not difficult at all - -    Past Medical History:  Diagnosis Date  . Panic attacks    Past Surgical History:  Procedure Laterality Date  . APPENDECTOMY     Social History   Socioeconomic History  . Marital status: Married    Spouse name: Not on file  . Number of children: Not on file  . Years of education: Not on file  . Highest education  level: Not on file  Occupational History  . Not on file  Tobacco Use  . Smoking status: Never Smoker  . Smokeless tobacco: Never Used  Substance and Sexual Activity  . Alcohol use: No  . Drug use: No  . Sexual activity: Yes  Other Topics Concern  . Not on file  Social History Narrative  . Not on file   Social Determinants of Health   Financial Resource Strain: Not on file  Food Insecurity: Not on file  Transportation Needs: Not on file  Physical Activity: Not on file  Stress: Not on file  Social Connections: Not on file  Intimate Partner Violence: Not on file   Family History  Problem Relation Age of Onset  . Congestive Heart Failure Mother   . Asthma Mother   . Cancer Father        skin, lymphoma  . Heart disease Father   . Skin cancer Father   . Lymphoma Father   . Breast cancer Neg Hx    Current Outpatient Medications on File Prior to Visit  Medication Sig  . citalopram (CELEXA) 20 MG tablet TAKE 0.5-1 TABLETS (10-20 MG TOTAL) BY MOUTH DAILY.  . meloxicam (MOBIC) 15 MG tablet Take 1 tablet by mouth 3 (three) times daily.  . valACYclovir (VALTREX) 1000 MG tablet valacyclovir 1 gram tablet  TAKE TWO PILLS EVERY 12 HOURS X 1  DAY. BEGIN AT ONSET OF SYMPTOMS.   No current facility-administered medications on file prior to visit.    Review of Systems  Constitutional: Negative for activity change, appetite change, chills, diaphoresis, fatigue and fever.  HENT: Negative for congestion and hearing loss.   Eyes: Negative for visual disturbance.  Respiratory: Negative for cough, chest tightness, shortness of breath and wheezing.   Cardiovascular: Negative for chest pain, palpitations and leg swelling.  Gastrointestinal: Negative for abdominal pain, constipation, diarrhea, nausea and vomiting.  Endocrine: Negative for cold intolerance.  Genitourinary: Negative for dysuria, frequency and hematuria.  Musculoskeletal: Negative for arthralgias and neck pain.  Skin: Negative  for rash.  Allergic/Immunologic: Negative for environmental allergies.  Neurological: Negative for dizziness, weakness, light-headedness, numbness and headaches.  Hematological: Negative for adenopathy.  Psychiatric/Behavioral: Negative for behavioral problems, dysphoric mood and sleep disturbance.   Per HPI unless specifically indicated above     Objective:    BP 98/73   Pulse 65   Temp 98 F (36.7 C) (Oral)   Ht 5' 1"  (1.549 m)   Wt 131 lb (59.4 kg)   LMP 02/16/2021   SpO2 100%   BMI 24.75 kg/m   Wt Readings from Last 3 Encounters:  02/19/21 131 lb (59.4 kg)  12/20/19 125 lb (56.7 kg)  07/05/19 123 lb (55.8 kg)    Physical Exam Vitals and nursing note reviewed.  Constitutional:      General: She is not in acute distress.    Appearance: She is well-developed. She is not diaphoretic.     Comments: Well-appearing, comfortable, cooperative  HENT:     Head: Normocephalic and atraumatic.  Eyes:     General:        Right eye: No discharge.        Left eye: No discharge.     Conjunctiva/sclera: Conjunctivae normal.     Pupils: Pupils are equal, round, and reactive to light.  Neck:     Thyroid: No thyromegaly.  Cardiovascular:     Rate and Rhythm: Normal rate and regular rhythm.     Heart sounds: Normal heart sounds. No murmur heard.   Pulmonary:     Effort: Pulmonary effort is normal. No respiratory distress.     Breath sounds: Normal breath sounds. No wheezing or rales.  Abdominal:     General: Bowel sounds are normal. There is no distension.     Palpations: Abdomen is soft. There is no mass.     Tenderness: There is no abdominal tenderness.  Musculoskeletal:        General: No tenderness. Normal range of motion.     Cervical back: Normal range of motion and neck supple.     Right lower leg: No edema.     Left lower leg: No edema.     Comments: Upper / Lower Extremities: - Normal muscle tone, strength bilateral upper extremities 5/5, lower extremities 5/5  R  Hand dupuytren's contracture 4th finger.  Lymphadenopathy:     Cervical: No cervical adenopathy.  Skin:    General: Skin is warm and dry.     Findings: No erythema or rash.  Neurological:     Mental Status: She is alert and oriented to person, place, and time.     Comments: Distal sensation intact to light touch all extremities  Psychiatric:        Behavior: Behavior normal.     Comments: Well groomed, good eye contact, normal speech and thoughts       Results  for orders placed or performed in visit on 10/12/20  Novel Coronavirus, NAA ([facility])   Specimen: Nasopharyngeal(NP) swabs in vial transport medium   Nasopharynge  Result Value Ref Range   SARS-CoV-2, NAA Not Detected Not Detected      Assessment & Plan:   Problem List Items Addressed This Visit   None   Visit Diagnoses    Annual physical exam    -  Primary   Relevant Orders   CBC with Differential/Platelet   COMPLETE METABOLIC PANEL WITH GFR   Lipid panel   TSH   Abnormal menstrual cycle       Relevant Orders   CBC with Differential/Platelet   Elevated LDL cholesterol level       Relevant Orders   COMPLETE METABOLIC PANEL WITH GFR   Lipid panel   TSH   Encounter for screening mammogram for malignant neoplasm of breast       Relevant Orders   MM DIGITAL SCREENING BILATERAL     Updated Health Maintenance information  Due for routine colon cancer screening. Never had colonoscopy (not interested), no family history colon cancer. - Ordered Mammogram [facility] today she will schedule  - Discussion today about recommendations for either Colonoscopy or Cologuard screening, benefits and risks of screening, interested in Cologuard, understands that if positive then recommendation is for diagnostic colonoscopy to follow-up. - Ordered Cologuard today  Return to [facility] for pap and abnormal menstrual cycle / perimenopausal - Age 56, with up to 2-3 month without cycle then has had heavier bleeding. No other  significant perimenopausal symptoms at this time. - She is due for Pap Smear as well, will schedule w/ GYN  Reviewed recent lab results with patient Encouraged improvement to lifestyle with diet and exercise Goal maintain healthy weight  #Arthritis, Joint Inflammation Chronic problem with previous dx Dupuytren's contracture R hand, now improved some less repetitive strain. Still affects her sometimes. Also has R foot/ankle problem followed by Orthopedic - Counseling on Meloxicam dosing 28m daily PRN should not use every day, use it 1-2 weeks at a time PRN then take breaks etc. - Trial topical Voltaren to avoid excess oral NSAID  Orders Placed This Encounter  Procedures  . MM DIGITAL SCREENING BILATERAL    Standing Status:   Future    Standing Expiration Date:   02/19/2022    Order Specific Question:   Reason for Exam (SYMPTOM  OR DIAGNOSIS REQUIRED)    Answer:   Routine screening bilateral mammogram. May change to Bilateral MM Tomo screening if indicated and appropriate for patient/insurance    Order Specific Question:   Preferred imaging location?    Answer:   [facility]  . CBC with Differential/Platelet  . COMPLETE METABOLIC PANEL WITH GFR  . Lipid panel    Order Specific Question:   Has the patient fasted?    Answer:   Yes  . TSH      No orders of the defined types were placed in this encounter.    Follow up plan: Return in about 1 year (around 02/19/2022) for 1 year Annual Physical (AM apt, fasting lab after).  ANobie Putnam DCountry [facility]ates[facility] 02/19/2021, 8:25 AM

## 2021-02-19 NOTE — Patient Instructions (Addendum)
Thank you for coming to the office today.  START anti inflammatory topical - OTC Voltaren (generic Diclofenac) topical 2-4 times a day as needed for pain swelling of affected joint for 1-2 weeks or longer.  For Mammogram screening for breast cancer   Call the [facility] below anytime to schedule your own appointment now that order has been placed.  [facility] [address], [place] [address] Phone: [phone]  Reminder - please call to schedule Rayne Du for routine women's health screening, pap smear, and early menopause menstrual symptoms.   Colon Cancer Screening: - For all adults age 40+ routine colon cancer screening is highly recommended.     - Recent guidelines from [place] recommend starting age of 63 - Early detection of colon cancer is important, because often there are no warning signs or symptoms, also if found early usually it can be cured. Late stage is hard to treat.  - If you are not interested in Colonoscopy screening (if done and normal you could be cleared for 5 to 10 years until next due), then Cologuard is an excellent alternative for screening test for Colon Cancer. It is highly sensitive for detecting DNA of colon cancer from even the earliest stages. Also, there is NO bowel prep required. - If Cologuard is NEGATIVE, then it is good for 3 years before next due - If Cologuard is POSITIVE, then it is strongly advised to get a Colonoscopy, which allows the GI doctor to locate the source of the cancer or polyp (even very early stage) and treat it by removing it. ------------------------- If you would like to proceed with Cologuard (stool DNA test) - FIRST, call your insurance company and tell them you want to check cost of Cologuard tell them CPT Code [phone] (it may be completely covered and you could get for no cost, OR max cost without any coverage is about $600). Also, keep in  mind if you do NOT open the kit, and decide not to do the test, you will NOT be charged, you should contact the company if you decide not to do the test. - If you want to proceed, you can notify us (phone message, [place], or at next visit) and we will order it for you. The test kit will be delivered to you house within about 1 week. Follow instructions to collect sample, you may call the company for any help or questions, 24/7 telephone support at [phone].   Please schedule a Follow-up Appointment to: Return in about 1 year (around 02/19/2022) for 1 year Annual Physical (AM apt, fasting lab after).  If you have any other questions or concerns, please feel free to call the office or send a message through [place]. You may also schedule an earlier appointment if necessary.  Additionally, you may be receiving a survey about your experience at our office within a few days to 1 week by e-mail or mail. We value your feedback.  Nobie Putnam, DO [place]

## 2021-02-20 ENCOUNTER — Encounter: Payer: Self-pay | Admitting: Family Medicine

## 2021-02-20 DIAGNOSIS — E7800 Pure hypercholesterolemia, unspecified: Secondary | ICD-10-CM | POA: Insufficient documentation

## 2021-02-20 DIAGNOSIS — R7989 Other specified abnormal findings of blood chemistry: Secondary | ICD-10-CM | POA: Insufficient documentation

## 2021-02-20 LAB — COMPLETE METABOLIC PANEL WITH GFR
AG Ratio: 1.7 (calc) (ref 1.0–2.5)
ALT: 8 U/L (ref 6–29)
AST: 14 U/L (ref 10–35)
Albumin: 4.1 g/dL (ref 3.6–5.1)
Alkaline phosphatase (APISO): 38 U/L (ref 31–125)
BUN: 17 mg/dL (ref 7–25)
CO2: 27 mmol/L (ref 20–32)
Calcium: 9.1 mg/dL (ref 8.6–10.2)
Chloride: 107 mmol/L (ref 98–110)
Creat: 0.63 mg/dL (ref 0.50–1.10)
GFR, Est African American: 125 mL/min/{1.73_m2} (ref >=60)
GFR, Est Non African American: 108 mL/min/{1.73_m2} (ref >=60)
Globulin: 2.4 g/dL (calc) (ref 1.9–3.7)
Glucose, Bld: 84 mg/dL (ref 65–99)
Potassium: 4.7 mmol/L (ref 3.5–5.3)
Sodium: 138 mmol/L (ref 135–146)
Total Bilirubin: 0.3 mg/dL (ref 0.2–1.2)
Total Protein: 6.5 g/dL (ref 6.1–8.1)

## 2021-02-20 LAB — LIPID PANEL
Cholesterol: 191 mg/dL (ref <200)
HDL: 65 mg/dL (ref >=50)
LDL Cholesterol (Calc): 115 mg/dL (calc) — ABNORMAL HIGH
Non-HDL Cholesterol (Calc): 126 mg/dL (calc) (ref <130)
Total CHOL/HDL Ratio: 2.9 (calc) (ref <5.0)
Triglycerides: 38 mg/dL (ref <150)

## 2021-02-20 LAB — CBC WITH DIFFERENTIAL/PLATELET
Absolute Monocytes: 349 cells/uL (ref 200–950)
Basophils Absolute: 41 cells/uL (ref 0–200)
Basophils Relative: 1 %
Eosinophils Absolute: 139 cells/uL (ref 15–500)
Eosinophils Relative: 3.4 %
HCT: 37.1 % (ref 35.0–45.0)
Hemoglobin: 12 g/dL (ref 11.7–15.5)
Lymphs Abs: 1451 cells/uL (ref 850–3900)
MCH: 31 pg (ref 27.0–33.0)
MCHC: 32.3 g/dL (ref 32.0–36.0)
MCV: 95.9 fL (ref 80.0–100.0)
MPV: 12.4 fL (ref 7.5–12.5)
Monocytes Relative: 8.5 %
Neutro Abs: 2120 cells/uL (ref 1500–7800)
Neutrophils Relative %: 51.7 %
Platelets: 187 10*3/uL (ref 140–400)
RBC: 3.87 10*6/uL (ref 3.80–5.10)
RDW: 12.2 % (ref 11.0–15.0)
Total Lymphocyte: 35.4 %
WBC: 4.1 10*3/uL (ref 3.8–10.8)

## 2021-02-20 LAB — TSH: TSH: 4.93 mIU/L — ABNORMAL HIGH

## 2021-04-04 ENCOUNTER — Other Ambulatory Visit: Payer: Self-pay | Admitting: Family Medicine

## 2021-04-04 DIAGNOSIS — F064 Anxiety disorder due to known physiological condition: Secondary | ICD-10-CM

## 2021-04-26 ENCOUNTER — Other Ambulatory Visit: Payer: Self-pay | Admitting: Family Medicine

## 2021-04-26 DIAGNOSIS — Z1231 Encounter for screening mammogram for malignant neoplasm of breast: Secondary | ICD-10-CM

## 2021-07-27 ENCOUNTER — Ambulatory Visit
Admission: RE | Admit: 2021-07-27 | Discharge: 2021-07-27 | Disposition: A | Discharge status: Home / Self Care | Payer: BC Managed Care – PPO | Source: Ambulatory Visit | Attending: Family Medicine | Admitting: Family Medicine

## 2021-07-27 ENCOUNTER — Other Ambulatory Visit: Payer: Self-pay

## 2021-07-27 DIAGNOSIS — Z1231 Encounter for screening mammogram for malignant neoplasm of breast: Secondary | ICD-10-CM

## 2021-12-06 ENCOUNTER — Ambulatory Visit
Admission: EM | Admit: 2021-12-06 | Discharge: 2021-12-06 | Disposition: A | Discharge status: Home / Self Care | Payer: BC Managed Care – PPO | Attending: Emergency Medicine | Admitting: Emergency Medicine

## 2021-12-06 ENCOUNTER — Other Ambulatory Visit: Payer: Self-pay

## 2021-12-06 ENCOUNTER — Ambulatory Visit (INDEPENDENT_AMBULATORY_CARE_PROVIDER_SITE_OTHER): Payer: BC Managed Care – PPO

## 2021-12-06 DIAGNOSIS — M25511 Pain in right shoulder: Secondary | ICD-10-CM | POA: Diagnosis not present

## 2021-12-06 MED ORDER — PREDNISONE 10 MG (21) PO TBPK: ORAL_TABLET | ORAL | 0 refills | Status: DC

## 2021-12-06 NOTE — Discharge Instructions (Addendum)
Your x-rays did not show any evidence of arthritis in your shoulder.  I suspect that your pain is coming from inflammation of the tendons in your shoulder girdle. ? ?Start the prednisone tomorrow morning and take with food each morning for 6 days according to the package instructions. ? ?Follow the shoulder range of motion exercises given in your discharge papers. ? ?If your symptoms do not improve, or they worsen, I recommend following up with your primary care provider for a referral for dedicated physical therapy or to sports medicine for possible targeted steroid injection. ?

## 2021-12-06 NOTE — ED Triage Notes (Signed)
Pt here with C/O right shoulder pain for 1 month. Did take meloxicam for pain for 2 weeks and it helped, stopped 1 week ago and pain came back stronger today. No known injury ?

## 2021-12-06 NOTE — ED Provider Notes (Signed)
[facility]    CSN: [id number] Arrival date & time: 12/06/21  1419      History   Chief Complaint Chief Complaint  Patient presents with   Shoulder Pain    HPI Sheila Clarke is a 46 y.o. female.   HPI  46 year old female here for evaluation of right shoulder pain.  Patient reports that she initially developed pain in her right shoulder 1 month ago.  She states that she woke up 1 morning and it was throbbing.  She states that she was not able to determine exactly where the pain was coming from as she felt the whole joint hurt.  She also had some numbness and tingling in her fingers.  She states that she started take meloxicam, which she had been prescribed previously for arthritis, and this helped.  She stopped taking the meloxicam a week ago and she reports that today the pain came flooding back.  Is more in the front of the shoulder and she is also complaining of numbness in her right pinky.  She denies any injury.  She also denies any pain in her neck.  She states she never had any imaging of her shoulder.  She does have a distant history of injury to the right shoulder for which she did physical therapy for years ago.  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      Home Medications    Prior to Admission medications   Medication Sig Start Date End Date Taking? Authorizing Provider   citalopram (CELEXA) 20 MG tablet TAKE 0.5-1 TABLETS (10-20 MG TOTAL) BY MOUTH DAILY. 04/04/21  Yes Karamalegos, Devonne Doughty, DO  meloxicam (MOBIC) 15 MG tablet Take 1 tablet by mouth 3 (three) times daily. 02/11/21  Yes [provider]  predniSONE (STERAPRED UNI-PAK 21 TAB) 10 MG (21) TBPK tablet Take 6 tablets on day 1, 5 tablets day 2, 4 tablets day 3, 3 tablets day 4, 2 tablets day 5, 1 tablet day 6 12/06/21  Yes Margarette Canada, NP  valACYclovir (VALTREX) 1000 MG tablet valacyclovir 1 gram tablet  TAKE TWO PILLS EVERY 12 HOURS X 1 DAY. BEGIN AT ONSET OF SYMPTOMS.   Yes [provider]    Family History Family History  Problem Relation Age of Onset   Congestive Heart Failure Mother    Asthma Mother    Cancer Father        skin, lymphoma   Heart disease Father    Skin cancer Father    Lymphoma Father    Breast cancer Neg Hx     Social History Social History   Tobacco Use   Smoking status: Never   Smokeless tobacco: Never  Vaping Use   Vaping Use: Never used  Substance Use Topics   Alcohol use: No   Drug use: No     Allergies  Patient has no known allergies.   Review of Systems Review of Systems  Musculoskeletal:  Positive for arthralgias. Negative for joint swelling.  Neurological:  Positive for numbness. Negative for weakness.  Hematological: Negative.   Psychiatric/Behavioral: Negative.      Physical Exam Triage Vital Signs ED Triage Vitals  Enc Vitals Group     BP 12/06/21 1431 116/83     Pulse Rate 12/06/21 1431 80     Resp 12/06/21 1431 18     Temp 12/06/21 1431 98.7 F (37.1 C)     Temp Source 12/06/21 1431 Oral     SpO2 12/06/21 1431 100 %     Weight 12/06/21 1430 127 lb (57.6 kg)     Height 12/06/21 1430 5' 1"  (1.549 m)     Head Circumference --      Peak Flow --      Pain Score 12/06/21 1428 4     Pain Loc --      Pain Edu? --      Excl. in [place]? --    No data found.  Updated Vital Signs BP 116/83 (BP Location: Left Arm)     Pulse 80    Temp 98.7 F (37.1 C) (Oral)    Resp 18    Ht 5' 1"  (1.549 m)    Wt 127 lb (57.6 kg)    LMP 08/27/2021    SpO2 100%    BMI 24.00 kg/m   Visual Acuity Right Eye Distance:   Left Eye Distance:   Bilateral Distance:    Right Eye Near:   Left Eye Near:    Bilateral Near:     Physical Exam Vitals and nursing note reviewed.  Constitutional:      Appearance: Normal appearance. She is not ill-appearing.  HENT:     Head: Normocephalic and atraumatic.  Musculoskeletal:        General: Tenderness present. No swelling, deformity or signs of injury. Normal range of motion.  Skin:    General: Skin is warm and dry.     Capillary Refill: Capillary refill takes less than 2 seconds.     Findings: No erythema or rash.  Neurological:     General: No focal deficit present.     Mental Status: She is alert and oriented to person, place, and time.     Sensory: No sensory deficit.     Motor: No weakness.  Psychiatric:        Mood and Affect: Mood normal.        Behavior: Behavior normal.        Thought Content: Thought content normal.        Judgment: Judgment normal.     UC Treatments / Results  Labs (all labs ordered are listed, but only abnormal results are displayed) Labs Reviewed - No data to display  EKG   Radiology DG Shoulder Right  Result Date: 12/06/2021 CLINICAL DATA:  pain in anterior joint x 1 month EXAM: RIGHT SHOULDER - 2+ VIEW COMPARISON:  None. FINDINGS: There is no evidence of fracture or dislocation. There is no evidence of arthropathy or other focal bone abnormality. Soft tissues are unremarkable. IMPRESSION: Negative. Electronically Signed   By: Iven Finn M.D.   On: 12/06/2021 15:13    Procedures Procedures (including critical care time)  Medications Ordered in UC Medications - No data to display  Initial Impression / Assessment and Plan / UC Course  I have reviewed the triage vital signs and the nursing  notes.  Pertinent labs & imaging results  that were available during my care of the patient were reviewed by me and considered in my medical decision making (see chart for details).  Patient is a very pleasant, nontoxic-appearing 46 year old female here for evaluation of right shoulder pain has been going on for the past month as outlined HPI above.  On exam patient's right shoulder is in normal anatomical alignment.  Her radial and ulnar pulses are 2+ and her grip strength in her right hand is 5/5.  Patient reports that her sensation is largely normal in all of her fingers except for she feels little decree sensation in her right fifth finger.  No tenderness with palpation of the muscle groups of the forearm, radius, or ulna.  No pain with palpation of the elbow.  Also no pain with palpation of the bicep and tricep muscle groups or deltoid muscle group.  Patient does have tenderness when palpating along the insertion of the pectoralis major muscle on the right as well as with some tenderness palpating the body of the right pectoralis major.  No tenderness with palpation of the trapezius or with palpating the T-spine and C-spine.  Patient does have some muscle tension in the superior aspect of the trapezius into the right cervical paraspinous region.  Will obtain radiograph of right shoulder to look for any signs of arthropathy.  Suspect patient has a degree of tendinopathy given the area of discomfort on palpation.  Right shoulder x-ray independently reviewed and evaluated by me.  Impression: No evidence of arthropathy or dislocation.  No evidence of soft tissue swelling.  Radiology overread is pending. Radiology impression shows no evidence of fracture or dislocation and no evidence of arthropathy or focal bone abnormality.  Soft tissues are unremarkable.  Negative exam.  Given patient physical exam and the lack of arthropathy on x-ray I believe that the patient is experiencing tendinopathy which is causing her pain.  I will discharge her home on a  prednisone pack that she can start tomorrow.  We will also give home physical therapy exercises.  If her symptoms continue she should follow-up with her PCP or orthopedics for possible dedicated physical therapy or targeted injection.   Final Clinical Impressions(s) / UC Diagnoses   Final diagnoses:  Acute pain of right shoulder     Discharge Instructions      Your x-rays did not show any evidence of arthritis in your shoulder.  I suspect that your pain is coming from inflammation of the tendons in your shoulder girdle.  Start the prednisone tomorrow morning and take with food each morning for 6 days according to the package instructions.  Follow the shoulder range of motion exercises given in your discharge papers.  If your symptoms do not improve, or they worsen, I recommend following up with your primary care provider for a referral for dedicated physical therapy or to sports medicine for possible targeted steroid injection.     ED Prescriptions     Medication Sig Dispense Auth. Provider   predniSONE (STERAPRED UNI-PAK 21 TAB) 10 MG (21) TBPK tablet Take 6 tablets on day 1, 5 tablets day 2, 4 tablets day 3, 3 tablets day 4, 2 tablets day 5, 1 tablet day 6 21 tablet Margarette Canada, NP      PDMP not reviewed this encounter.   Margarette Canada, NP 12/06/21 1524

## 2021-12-14 ENCOUNTER — Other Ambulatory Visit: Payer: Self-pay

## 2021-12-14 ENCOUNTER — Ambulatory Visit (INDEPENDENT_AMBULATORY_CARE_PROVIDER_SITE_OTHER): Payer: BC Managed Care – PPO | Admitting: Family Medicine

## 2021-12-14 ENCOUNTER — Encounter: Payer: Self-pay | Admitting: Family Medicine

## 2021-12-14 VITALS — BP 99/75 | HR 93 | Ht 61.0 in | Wt 131.8 lb

## 2021-12-14 DIAGNOSIS — M7551 Bursitis of right shoulder: Secondary | ICD-10-CM

## 2021-12-14 DIAGNOSIS — M25511 Pain in right shoulder: Secondary | ICD-10-CM | POA: Diagnosis not present

## 2021-12-14 MED ORDER — MELOXICAM 15 MG PO TABS: 15.0000 mg | ORAL_TABLET | Freq: Every day | ORAL | 2 refills | Status: DC | PRN

## 2021-12-14 MED ORDER — BACLOFEN 10 MG PO TABS: 5.0000 mg | ORAL_TABLET | Freq: Three times a day (TID) | ORAL | 2 refills | Status: DC | PRN

## 2021-12-14 NOTE — Progress Notes (Signed)
? ?Subjective:  ? ? Patient ID: Vernice Jefferson, female    DOB: 10-07-1976, 46 y.o.   MRN: [id number] ? ?LIMA CHILLEMI is a 46 y.o. female presenting on 12/14/2021 for Shoulder Pain ? ? ?HPI ? ?Right Shoulder Pain ?Reports onset about 4 weeks ago with R shoulder generalized pain anterior and posterior and radiating into arm into hand. No known injury or inciting or provoking scenario. Only new change in past with new job position change in July 2022, she keeps arms up at higher level to use keyboard mouse. She did recall there was a muscle knot in the shoulder and husband tried to massage the muscle knot out. ? ?Past history 12 years ago, Injured R shoulder at work, she did physical therapy at that time. She was dx with inflamed rotator cuff muscle at that time and it improved. She would occasionally get muscle knot or spasm. ? ?Tried meloxicam PRN with some good relief. ? ?She went to [facility] 12/06/21 and was treated with Prednisone taper,  Initially radiating symptoms  arm improved we/ prednisone. ? ?Out of meloxicam, it did help. She took for about 2 weeks initially 19m daily. ?Not on muscle relaxant ?Heat did help ? ?Denies numbness tingling into arm hand ? ? ?[facility]2021 12/20/2019 11/27/2018  ?Decreased Interest 0 0 0  ?Down, Depressed, Hopeless 0 0 0  ?PHQ - 2 Score 0 0 0  ?Altered sleeping 0 - -  ?Tired, decreased energy 0 - -  ?Change in appetite 0 - -  ?Feeling bad or failure about yourself  0 - -  ?Trouble concentrating 0 - -  ?Moving slowly or fidgety/restless 0 - -  ?Suicidal thoughts 0 - -  ?PHQ-9 Score 0 - -  ?Difficult doing work/chores Not difficult at all - -  ? ? ?Social History  ? ?Tobacco Use  ? Smoking status: Never  ? Smokeless tobacco: Never  ?Vaping Use  ? Vaping Use: Never used  ?Substance Use Topics  ? Alcohol use: No  ? Drug use: No  ? ? ?Review of Systems ?Per HPI unless specifically indicated above ? ?   ?Objective:  ?  ?BP 99/75   Pulse 93   Ht 5' 1"   (1.549 m)   Wt 131 lb 12.8 oz (59.8 kg)   SpO2 97%   BMI 24.90 kg/m?   ?Wt Readings from Last 3 Encounters:  ?12/14/21 131 lb 12.8 oz (59.8 kg)  ?12/06/21 127 lb (57.6 kg)  ?02/19/21 131 lb (59.4 kg)  ?  ?Physical Exam ?Vitals and nursing note reviewed.  ?Constitutional:   ?   General: She is not in acute distress. ?   Appearance: Normal appearance. She is well-developed. She is not diaphoretic.  ?   Comments: Well-appearing, comfortable, cooperative  ?HENT:  ?   Head: Normocephalic and atraumatic.  ?Eyes:  ?   General:     ?   Right eye: No discharge.     ?   Left eye: No discharge.  ?   Conjunctiva/sclera: Conjunctivae normal.  ?Cardiovascular:  ?   Rate and Rhythm: Normal rate.  ?Pulmonary:  ?   Effort: Pulmonary effort is normal.  ?Musculoskeletal:  ?   Comments: RIGHT Shoulder ?Inspection: Normal appearance bilateral symmetrical ?Palpation: mild tender to palpation over anterior, lateral, or posterior shoulder  ?ROM: Reduced ROM forward flex and internal rotation abduction. ?Special Testing: Rotator cuff testing negative for weakness with supraspinatus full can and empty can test,  Hawkin's AC impingement POSITIVE for pain ?Strength: Normal strength 5/5 flex/ext, ext rot / int rot, grip, rotator cuff str testing. ?Neurovascular: Distally intact pulses, sensation to light touch ?  ?Skin: ?   General: Skin is warm and dry.  ?   Findings: No erythema or rash.  ?Neurological:  ?   Mental Status: She is alert and oriented to person, place, and time.  ?Psychiatric:     ?   Mood and Affect: Mood normal.     ?   Behavior: Behavior normal.     ?   Thought Content: Thought content normal.  ?   Comments: Well groomed, good eye contact, normal speech and thoughts  ? ? ?I have personally reviewed the radiology report from 12/06/21 on R Shoulder X-ray. ? ?DG Shoulder RightPerformed 12/06/2021 ?Final result  ?Study Result ?CLINICAL DATA: pain in anterior joint x 1 month ? ?EXAM: ?RIGHT SHOULDER - 2+ VIEW ? ?COMPARISON:  None. ? ?FINDINGS: ?There is no evidence of fracture or dislocation. There is no ?evidence of arthropathy or other focal bone abnormality. Soft ?tissues are unremarkable. ? ?IMPRESSION: ?Negative. ? ? ?Electronically Signed ?By: Iven Finn M.D. ?On: 12/06/2021 15:13 ? ? ? ?Results for orders placed or performed in visit on 02/19/21  ?CBC with Differential/Platelet  ?Result Value Ref Range  ? WBC 4.1 3.8 - 10.8 Thousand/uL  ? RBC 3.87 3.80 - 5.10 Million/uL  ? Hemoglobin 12.0 11.7 - 15.5 g/dL  ? HCT 37.1 35.0 - 45.0 %  ? MCV 95.9 80.0 - 100.0 fL  ? MCH 31.0 27.0 - 33.0 pg  ? MCHC 32.3 32.0 - 36.0 g/dL  ? RDW 12.2 11.0 - 15.0 %  ? Platelets 187 140 - 400 Thousand/uL  ? MPV 12.4 7.5 - 12.5 fL  ? Neutro Abs 2,120 1,500 - 7,800 cells/uL  ? Lymphs Abs 1,451 850 - 3,900 cells/uL  ? Absolute Monocytes 349 200 - 950 cells/uL  ? Eosinophils Absolute 139 15 - 500 cells/uL  ? Basophils Absolute 41 0 - 200 cells/uL  ? Neutrophils Relative % 51.7 %  ? Total Lymphocyte 35.4 %  ? Monocytes Relative 8.5 %  ? Eosinophils Relative 3.4 %  ? Basophils Relative 1.0 %  ?COMPLETE METABOLIC PANEL WITH GFR  ?Result Value Ref Range  ? Glucose, Bld 84 65 - 99 mg/dL  ? BUN 17 7 - 25 mg/dL  ? Creat 0.63 0.50 - 1.10 mg/dL  ? GFR, Est Non African American 108 > OR = 60 mL/min/1.37m  ? GFR, Est African American 125 > OR = 60 mL/min/1.775m ? BUN/Creatinine Ratio NOT APPLICABLE 6 - 22 (calc)  ? Sodium 138 135 - 146 mmol/L  ? Potassium 4.7 3.5 - 5.3 mmol/L  ? Chloride 107 98 - 110 mmol/L  ? CO2 27 20 - 32 mmol/L  ? Calcium 9.1 8.6 - 10.2 mg/dL  ? Total Protein 6.5 6.1 - 8.1 g/dL  ? Albumin 4.1 3.6 - 5.1 g/dL  ? Globulin 2.4 1.9 - 3.7 g/dL (calc)  ? AG Ratio 1.7 1.0 - 2.5 (calc)  ? Total Bilirubin 0.3 0.2 - 1.2 mg/dL  ? Alkaline phosphatase (APISO) 38 31 - 125 U/L  ? AST 14 10 - 35 U/L  ? ALT 8 6 - 29 U/L  ?Lipid panel  ?Result Value Ref Range  ? Cholesterol 191 <200 mg/dL  ? HDL 65 > OR = 50 mg/dL  ? Triglycerides 38 <150 mg/dL  ? LDL  Cholesterol (Calc) 115 (H) mg/dL (  calc)  ? Total CHOL/HDL Ratio 2.9 <5.0 (calc)  ? Non-HDL Cholesterol (Calc) 126 <130 mg/dL (calc)  ?TSH  ?Result Value Ref Range  ? TSH 4.93 (H) mIU/L  ? ?   ?Assessment & Plan:  ? ?Problem List Items Addressed This Visit   ?None ?Visit Diagnoses   ? ? Bursitis of right shoulder    -  Primary  ? Relevant Medications  ? meloxicam (MOBIC) 15 MG tablet  ? baclofen (LIORESAL) 10 MG tablet  ? Acute pain of right shoulder      ? ?  ?  ?Consistent with subacute R-shoulder bursitis vs rotator cuff tendinopathy with some reduced active ROM but without significant evidence of muscle tear (no weakness).  ?Some repetitive strain at times on shoulder w/ using keyboard mouse for long hours. ? ?Past history of R shoulder injury, no clear diagnosis years ago. ? ?- X-ray R shoulder done earlier this month 12/2021 listed above, Negative. No arthritis ? ?Plan: ?Already completed Prednisone and NSAID ?Will resume NSAID Meloxicam 23m daily PRN use for 1-2 weeks then pause can repeat ?Add Baclofen 5-125mQHS PRN caution sedation ?Tylenol as advised ?Relative rest but keep shoulder mobile, demonstrated ROM exercises, avoid heavy lifting ?heating pad PRN ? ?Follow-up 4-6 weeks if not improved for re-evaluation, consider referral to Physical Therapy, X-rays, and or subacromial steroid injection ? ? ?Meds ordered this encounter  ?Medications  ? meloxicam (MOBIC) 15 MG tablet  ?  Sig: Take 1 tablet (15 mg total) by mouth daily as needed for pain.  ?  Dispense:  30 tablet  ?  Refill:  2  ? baclofen (LIORESAL) 10 MG tablet  ?  Sig: Take 0.5-1 tablets (5-10 mg total) by mouth 3 (three) times daily as needed for muscle spasms.  ?  Dispense:  30 each  ?  Refill:  2  ? ? ? ? ?Follow up plan: ?Return in about 4 weeks (around 01/11/2022), or if symptoms worsen or fail to improve, for R shoulder bursitis. ? ? ?AlNobie PutnamDO ?[facility] ?12/14/2021, 8:28 AM ?

## 2021-12-14 NOTE — Patient Instructions (Addendum)
Thank you for coming to the office today.  Most likely you have bursitis of your shoulder. This is inflammation of the shoulder joint caused most often by arthritis or wear and tear. Often it can flare up to cause bursitis due to repetitive activities or other triggers. It may take time to heal, possibly 2 to 6 weeks, and it is important to avoid over use of shoulder especially above head motions that can re-aggravate the problem.  Recommend trial of Anti-inflammatory with Meloixcam 72m - take one with food and plenty of water ONCE daily every day (breakfast and dinner), for next 2 to 4 weeks, then you may take only as needed  - DO NOT TAKE any ibuprofen, aleve, motrin while you are taking this medicine  Start taking Baclofen (Lioresal) 163m(muscle relaxant) - start with half (cut) to one whole pill at night as needed for next 1-3 nights (may make you drowsy, caution with driving) see how it affects you, then if tolerated increase to one pill 2 to 3 times a day or (every 8 hours as needed)  Recommend to start taking Tylenol Extra Strength 50074mabs - take 1 to 2 tabs per dose (max 1000m67mvery 6-8 hours for pain (take regularly, don't skip a dose for next 7 days), max 24 hour daily dose is 6 tablets or 3000mg21m the future you can repeat the same everyday Tylenol course for 1-2 weeks at a time.   Xray Negative. Likely muscles / tendonitis / bursitis  Also we can refer you to Physical Therapy if just need to improve on range of motion.  Lastly if not improved by about 4-6 weeks - or just need treatment sooner - call and return for a Steroid Injection in shoulder.   Please schedule a Follow-up Appointment to: Return in about 4 weeks (around 01/11/2022), or if symptoms worsen or fail to improve, for R shoulder bursitis.  If you have any other questions or concerns, please feel free to call the office or send a message through [place] may also schedule an earlier appointment if  necessary.  Additionally, you may be receiving a survey about your experience at our office within a few days to 1 week by e-mail or mail. We value your feedback.  AlexaNobie PutnamSouthMin[facility] Shoulder Exercises  [place] your good arm against a counter or table for support - [facility] [place] [facility] [place] with a wide stance (make sure your body is comfortable) - Your painful shoulder should hang down and feel "heavy" - Gently move your painful arm in small circles "clockwise" for several turns - Switch to "counterclockwise" for several turns - Early on keep circles narrow and move slowly - Later in rehab, move in larger circles and faster movement   Wall Crawl - Stand close (about 1-2 ft away) to a wall, facing it directly - Reach out with your arm of painful shoulder and place fingers (not palm) on wall - You should make contact with wall at your waist level - Slowly walk your fingers up the wall. Stay in contact with wall entire time, do not remove fingers - Keep walking fingers up wall until you reach shoulder level - You may feel tightening or mild discomfort, once you reach a height that causes pain or if you are already above your shoulder height then stop. Repeat from starting position. - Early on stand closer to wall, move fingers slowly,  and stay at or below shoulder level - Later in rehab, stand farther away from wall (fingertips), move fingers quicker, go above shoulder level     Shoulder Impingement Syndrome Rehab Ask your health care provider which exercises are safe for you. Do exercises exactly as told by your health care provider and adjust them as directed. It is normal to feel mild stretching, pulling, tightness, or discomfort as you do these exercises. Stop right away if you feel sudden pain or your pain gets worse. Do not begin these exercises until told by your health care provider. Stretching and  range-of-motion exercise This exercise warms up your muscles and joints and improves the movement and flexibility of your shoulder. This exercise also helps to relieve pain and stiffness. Passive horizontal adduction In passive adduction, you use your other hand to move the injured arm toward your body. The injured arm does not move on its own. In this movement, your arm is moved across your body in the horizontal plane (horizontal adduction). Sit or stand and pull your left / right elbow across your chest, toward your other shoulder. Stop when you feel a gentle stretch in the back of your shoulder and upper arm. Keep your arm at shoulder height. Keep your arm as close to your body as you comfortably can. Hold for __________ seconds. Slowly return to the starting position. Repeat __________ times. Complete this exercise __________ times a day. Strengthening exercises These exercises build strength and endurance in your shoulder. Endurance is the ability to use your muscles for a long time, even after they get tired. External rotation, isometric This is an exercise in which you press the back of your wrist against a door frame without moving your shoulder joint (isometric). Stand or sit in a doorway, facing the door frame. Bend your left / right elbow and place the back of your wrist against the door frame. Only the back of your wrist should be touching the frame. Keep your upper arm at your side. Gently press your wrist against the door frame, as if you are trying to push your arm away from your abdomen (external rotation). Press as hard as you are able without pain. Avoid shrugging your shoulder while you press your wrist against the door frame. Keep your shoulder blade tucked down toward the middle of your back. Hold for __________ seconds. Slowly release the tension, and relax your muscles completely before you repeat the exercise. Repeat __________ times. Complete this exercise __________  times a day. Internal rotation, isometric This is an exercise in which you press your palm against a door frame without moving your shoulder joint (isometric). Stand or sit in a doorway, facing the door frame. Bend your left / right elbow and place the palm of your hand against the door frame. Only your palm should be touching the frame. Keep your upper arm at your side. Gently press your hand against the door frame, as if you are trying to push your arm toward your abdomen (internal rotation). Press as hard as you are able without pain. Avoid shrugging your shoulder while you press your hand against the door frame. Keep your shoulder blade tucked down toward the middle of your back. Hold for __________ seconds. Slowly release the tension, and relax your muscles completely before you repeat the exercise. Repeat __________ times. Complete this exercise __________ times a day. Scapular protraction, supine  Lie on your back on a firm surface (supine position). Hold a __________ weight in your left /  right hand. Raise your left / right arm straight into the air so your hand is directly above your shoulder joint. Push the weight into the air so your shoulder (scapula) lifts off the surface that you are lying on. The scapula will push up or forward (protraction). Do not move your head, neck, or back. Hold for __________ seconds. Slowly return to the starting position. Let your muscles relax completely before you repeat this exercise. Repeat __________ times. Complete this exercise __________ times a day. Scapular retraction  Sit in a stable chair without armrests, or stand up. Secure an exercise band to a stable object in front of you so the band is at shoulder height. Hold one end of the exercise band in each hand. Your palms should face down. Squeeze your shoulder blades together (retraction) and move your elbows slightly behind you. Do not shrug your shoulders upward while you do this. Hold for  __________ seconds. Slowly return to the starting position. Repeat __________ times. Complete this exercise __________ times a day. Shoulder extension  Sit in a stable chair without armrests, or stand up. Secure an exercise band to a stable object in front of you so the band is above shoulder height. Hold one end of the exercise band in each hand. Straighten your elbows and lift your hands up to shoulder height. Squeeze your shoulder blades together and pull your hands down to the sides of your thighs (extension). Stop when your hands are straight down by your sides. Do not let your hands go behind your body. Hold for __________ seconds. Slowly return to the starting position. Repeat __________ times. Complete this exercise __________ times a day. This information is not intended to replace advice given to you by your health care provider. Make sure you discuss any questions you have with your health care provider. Document Revised: 01/15/2019 Document Reviewed: 10/19/2018 Elsevier Patient Education  [place].

## 2022-02-22 ENCOUNTER — Ambulatory Visit (INDEPENDENT_AMBULATORY_CARE_PROVIDER_SITE_OTHER): Payer: BC Managed Care – PPO | Admitting: Family Medicine

## 2022-02-22 ENCOUNTER — Encounter: Payer: Self-pay | Admitting: Family Medicine

## 2022-02-22 VITALS — BP 118/70 | HR 80 | Ht 61.0 in | Wt 125.2 lb

## 2022-02-22 DIAGNOSIS — Z124 Encounter for screening for malignant neoplasm of cervix: Secondary | ICD-10-CM

## 2022-02-22 DIAGNOSIS — Z0000 Encounter for general adult medical examination without abnormal findings: Secondary | ICD-10-CM | POA: Diagnosis not present

## 2022-02-22 DIAGNOSIS — Z131 Encounter for screening for diabetes mellitus: Secondary | ICD-10-CM

## 2022-02-22 DIAGNOSIS — R7989 Other specified abnormal findings of blood chemistry: Secondary | ICD-10-CM | POA: Diagnosis not present

## 2022-02-22 DIAGNOSIS — E7800 Pure hypercholesterolemia, unspecified: Secondary | ICD-10-CM

## 2022-02-22 DIAGNOSIS — F064 Anxiety disorder due to known physiological condition: Secondary | ICD-10-CM

## 2022-02-22 DIAGNOSIS — F410 Panic disorder [episodic paroxysmal anxiety] without agoraphobia: Secondary | ICD-10-CM

## 2022-02-22 DIAGNOSIS — N951 Menopausal and female climacteric states: Secondary | ICD-10-CM

## 2022-02-22 DIAGNOSIS — Z1211 Encounter for screening for malignant neoplasm of colon: Secondary | ICD-10-CM

## 2022-02-22 MED ORDER — CITALOPRAM HYDROBROMIDE 10 MG PO TABS: 10.0000 mg | ORAL_TABLET | Freq: Every day | ORAL | 3 refills | Status: DC

## 2022-02-22 NOTE — Assessment & Plan Note (Signed)
Stable LDL Low ASCVD Encourage lifestyle management

## 2022-02-22 NOTE — Patient Instructions (Addendum)
Thank you for coming to the office today.  Referral to [facility] for pap smear, good for 3-5 years after  Perimenopausal with early symptoms, can be unpredictable, diagnosis only after 12 months no period.   Ordered the Cologuard (home kit) test for colon cancer screening. Stay tuned for further updates.  It will be shipped to you directly. If not received in 2-4 weeks, call us or the company.   If you send it back and no results are received in 2-4 weeks, call us or the company as well!   Colon Cancer Screening: - For all adults age 58+ routine colon cancer screening is highly recommended.     - Recent guidelines from [place] recommend starting age of 35 - Early detection of colon cancer is important, because often there are no warning signs or symptoms, also if found early usually it can be cured. Late stage is hard to treat.   - If Cologuard is NEGATIVE, then it is good for 3 years before next due - If Cologuard is POSITIVE, then it is strongly advised to get a Colonoscopy, which allows the GI doctor to locate the source of the cancer or polyp (even very early stage) and treat it by removing it. ------------------------- Follow instructions to collect sample, you may call the company for any help or questions, 24/7 telephone support at [phone].   Please schedule a Follow-up Appointment to: Return in about 1 year (around 02/23/2023) for 1 year Annual Physical AM fasting lab after.  If you have any other questions or concerns, please feel free to call the office or send a message through [place]. You may also schedule an earlier appointment if necessary.  Additionally, you may be receiving a survey about your experience at our office within a few days to 1 week by e-mail or mail. We value your feedback.  Nobie Putnam, DO [place]

## 2022-02-22 NOTE — Progress Notes (Signed)
Subjective:    Patient ID: Sheila Clarke, female    DOB: [birth year], 46 y.o.   MRN: [id number]  Sheila Clarke is a 46 y.o. female presenting on 02/22/2022 for Annual Exam   HPI  Annual Physical and Lab Orders. - Lab results available. No significant abnormality. - She is doing well with general health Admits more sedentary with work Walking more for exercise Modifying diet. More water, less tea.    Additional concern today   Updates History of Arthritis, Joint Pain, Dupuytren's Contracture Followed by Orthopedic Dr Sabra Heck - given rx Meloxicam 34m daily, for foot pain inflammation, has had some improvement.   Perimenopausal Abnormal Menstrual Bleeding Episodes of abnormal menstrual cycle, missed 3 months or more, then resumed w/ abnormal heavier bleeding, perimenopausal. No fam history of menopause due to other females in family w hysterectomy  Anxiety Controlled on Citalopram 152m she takes half of 2068mwinter or Xmas time she takes more sometimes.  Doing well, needs refill   Health Maintenance:   Colon CA Screening: Never had colonoscopy. Currently asymptomatic. No known family history of colon CA. Due for screening test considering Cologuard, counseling given   Last pap smear done negative 01/2017 [facility] - she will contact them to schedule      02/22/2022   12:05 PM 02/19/2021    8:15 AM 12/20/2019    9:26 AM  Depression screen PHQ 2/9  Decreased Interest 0 0 0  Down, Depressed, Hopeless 0 0 0  PHQ - 2 Score 0 0 0  Altered sleeping 0 0   Tired, decreased energy 0 0   Change in appetite 0 0   Feeling bad or failure about yourself  0 0   Trouble concentrating 0 0   Moving slowly or fidgety/restless 0 0   Suicidal thoughts 0 0   PHQ-9 Score 0 0   Difficult doing work/chores Not difficult at all Not difficult at all       02/19/2021    8:15 AM 11/27/2018   11:17 PM 11/21/2017   10:10 AM 12/25/2016    2:07 PM  GAD 7 : Generalized Anxiety Score  Nervous,  Anxious, on Edge 0 0 1 0  Control/stop worrying 0 0 0 0  Worry too much - different things 0 0 0 0  Trouble relaxing 0 0 0 0  Restless 0 0 0 0  Easily annoyed or irritable 0 0 0 0  Afraid - awful might happen 0 0 0 0  Total GAD 7 Score 0 0 1 0  Anxiety Difficulty  Not difficult at all Not difficult at all Not difficult at all      Past Medical History:  Diagnosis Date   Panic attacks    Past Surgical History:  Procedure Laterality Date   APPENDECTOMY     Social History   Socioeconomic History   Marital status: Married    Spouse name: Not on file   Number of children: Not on file   Years of education: Not on file   Highest education level: Not on file  Occupational History   Not on file  Tobacco Use   Smoking status: Never   Smokeless tobacco: Never  Vaping Use   Vaping Use: Never used  Substance and Sexual Activity   Alcohol use: No   Drug use: No   Sexual activity: Yes  Other Topics Concern   Not on file  Social History Narrative   Not on file   Social  Determinants of Health   Financial Resource Strain: Not on file  Food Insecurity: Not on file  Transportation Needs: Not on file  Physical Activity: Not on file  Stress: Not on file  Social Connections: Not on file  Intimate Partner Violence: Not on file   Family History  Problem Relation Age of Onset   Congestive Heart Failure Mother    Asthma Mother    Cancer Father        skin, lymphoma   Heart disease Father    Skin cancer Father    Lymphoma Father    Breast cancer Neg Hx    Current Outpatient Medications on File Prior to Visit  Medication Sig   baclofen (LIORESAL) 10 MG tablet Take 0.5-1 tablets (5-10 mg total) by mouth 3 (three) times daily as needed for muscle spasms.   meloxicam (MOBIC) 15 MG tablet Take 1 tablet (15 mg total) by mouth daily as needed for pain.   valACYclovir (VALTREX) 1000 MG tablet valacyclovir 1 gram tablet  TAKE TWO PILLS EVERY 12 HOURS X 1 DAY. BEGIN AT ONSET OF  SYMPTOMS.   No current facility-administered medications on file prior to visit.    Review of Systems  Constitutional:  Negative for activity change, appetite change, chills, diaphoresis, fatigue and fever.  HENT:  Negative for congestion and hearing loss.   Eyes:  Negative for visual disturbance.  Respiratory:  Negative for cough, chest tightness, shortness of breath and wheezing.   Cardiovascular:  Negative for chest pain, palpitations and leg swelling.  Gastrointestinal:  Negative for abdominal pain, constipation, diarrhea, nausea and vomiting.  Genitourinary:  Negative for dysuria, frequency and hematuria.  Musculoskeletal:  Negative for arthralgias and neck pain.  Skin:  Negative for rash.  Neurological:  Negative for dizziness, weakness, light-headedness, numbness and headaches.  Hematological:  Negative for adenopathy.  Psychiatric/Behavioral:  Negative for behavioral problems, dysphoric mood and sleep disturbance.   Per HPI unless specifically indicated above      Objective:    BP 118/70   Pulse 80   Ht 5' 1"  (1.549 m)   Wt 125 lb 3.2 oz (56.8 kg)   SpO2 99%   BMI 23.66 kg/m   Wt Readings from Last 3 Encounters:  02/22/22 125 lb 3.2 oz (56.8 kg)  12/14/21 131 lb 12.8 oz (59.8 kg)  12/06/21 127 lb (57.6 kg)    Physical Exam Vitals and nursing note reviewed.  Constitutional:      General: She is not in acute distress.    Appearance: She is well-developed. She is not diaphoretic.     Comments: Well-appearing, comfortable, cooperative  HENT:     Head: Normocephalic and atraumatic.  Eyes:     General:        Right eye: No discharge.        Left eye: No discharge.     Conjunctiva/sclera: Conjunctivae normal.     Pupils: Pupils are equal, round, and reactive to light.  Neck:     Thyroid: No thyromegaly.  Cardiovascular:     Rate and Rhythm: Normal rate and regular rhythm.     Pulses: Normal pulses.     Heart sounds: Normal heart sounds. No murmur  heard. Pulmonary:     Effort: Pulmonary effort is normal. No respiratory distress.     Breath sounds: Normal breath sounds. No wheezing or rales.  Abdominal:     General: Bowel sounds are normal. There is no distension.     Palpations: Abdomen is soft.  There is no mass.     Tenderness: There is no abdominal tenderness.  Musculoskeletal:        General: No tenderness. Normal range of motion.     Cervical back: Normal range of motion and neck supple.     Comments: Upper / Lower Extremities: - Normal muscle tone, strength bilateral upper extremities 5/5, lower extremities 5/5  Lymphadenopathy:     Cervical: No cervical adenopathy.  Skin:    General: Skin is warm and dry.     Findings: No erythema or rash.  Neurological:     Mental Status: She is alert and oriented to person, place, and time.     Comments: Distal sensation intact to light touch all extremities  Psychiatric:        Mood and Affect: Mood normal.        Behavior: Behavior normal.        Thought Content: Thought content normal.     Comments: Well groomed, good eye contact, normal speech and thoughts      Results for orders placed or performed in visit on 02/19/21  CBC with Differential/Platelet  Result Value Ref Range   WBC 4.1 3.8 - 10.8 Thousand/uL   RBC 3.87 3.80 - 5.10 Million/uL   Hemoglobin 12.0 11.7 - 15.5 g/dL   HCT 37.1 35.0 - 45.0 %   MCV 95.9 80.0 - 100.0 fL   MCH 31.0 27.0 - 33.0 pg   MCHC 32.3 32.0 - 36.0 g/dL   RDW 12.2 11.0 - 15.0 %   Platelets 187 140 - 400 Thousand/uL   MPV 12.4 7.5 - 12.5 fL   Neutro Abs 2,120 1,500 - 7,800 cells/uL   Lymphs Abs 1,451 850 - 3,900 cells/uL   Absolute Monocytes 349 200 - 950 cells/uL   Eosinophils Absolute 139 15 - 500 cells/uL   Basophils Absolute 41 0 - 200 cells/uL   Neutrophils Relative % 51.7 %   Total Lymphocyte 35.4 %   Monocytes Relative 8.5 %   Eosinophils Relative 3.4 %   Basophils Relative 1.0 %  COMPLETE METABOLIC PANEL WITH GFR  Result Value  Ref Range   Glucose, Bld 84 65 - 99 mg/dL   BUN 17 7 - 25 mg/dL   Creat 0.63 0.50 - 1.10 mg/dL   GFR, Est Non African American 108 > OR = 60 mL/min/1.78m   GFR, Est African American 125 > OR = 60 mL/min/1.770m  BUN/Creatinine Ratio NOT APPLICABLE 6 - 22 (calc)   Sodium 138 135 - 146 mmol/L   Potassium 4.7 3.5 - 5.3 mmol/L   Chloride 107 98 - 110 mmol/L   CO2 27 20 - 32 mmol/L   Calcium 9.1 8.6 - 10.2 mg/dL   Total Protein 6.5 6.1 - 8.1 g/dL   Albumin 4.1 3.6 - 5.1 g/dL   Globulin 2.4 1.9 - 3.7 g/dL (calc)   AG Ratio 1.7 1.0 - 2.5 (calc)   Total Bilirubin 0.3 0.2 - 1.2 mg/dL   Alkaline phosphatase (APISO) 38 31 - 125 U/L   AST 14 10 - 35 U/L   ALT 8 6 - 29 U/L  Lipid panel  Result Value Ref Range   Cholesterol 191 <200 mg/dL   HDL 65 > OR = 50 mg/dL   Triglycerides 38 <150 mg/dL   LDL Cholesterol (Calc) 115 (H) mg/dL (calc)   Total CHOL/HDL Ratio 2.9 <5.0 (calc)   Non-HDL Cholesterol (Calc) 126 <130 mg/dL (calc)  TSH  Result Value Ref Range  TSH 4.93 (H) mIU/L      Assessment & Plan:   Problem List Items Addressed This Visit     Elevated TSH    Very mild elevated. Not in range of concern Asymptomatic Follow-up yearly TSH panel       Relevant Orders   T4, free   TSH   Elevated LDL cholesterol level    Stable LDL Low ASCVD Encourage lifestyle management       Relevant Orders   COMPLETE METABOLIC PANEL WITH GFR   Lipid panel   T4, free   Anxiety disorder due to general medical condition with panic attack    Stable, chronic anxiety >20 years with infrequent panic attacks, without other complications Not consistent with GAD, or associated mood disorder. Well functioning. -GAD7: 0 / PHQ9: 0 Controlled on Celexa Med history: prior on Klonopin, Wellbutrin Not followed by Psychiatry / Psychology  Plan: 1. Reduce dose Celexa 63m daily - instead of half tab, we can add extra if need during holidays 2. Continue self management of rare panic attacks -  breathing technique, no additional treatment needed 3. Consider psychology / therapy in future if needed 4. Follow-up 1 year for Annual Physical, GAD7/PHQ9       Relevant Medications   citalopram (CELEXA) 10 MG tablet   Other Visit Diagnoses     Annual physical exam    -  Primary   Relevant Orders   COMPLETE METABOLIC PANEL WITH GFR   CBC with Differential/Platelet   Lipid panel   Hemoglobin A1c   Screening for diabetes mellitus (DM)       Relevant Orders   Hemoglobin A1c   Screening for colon cancer       Relevant Orders   Cologuard   Cervical cancer screening       Relevant Orders   Ambulatory referral to Obstetrics / Gynecology   Perimenopausal       Relevant Orders   Ambulatory referral to Obstetrics / Gynecology       Updated Health Maintenance information Reviewed recent lab results with patient Encouraged improvement to lifestyle with diet and exercise Goal of weight loss  Due for routine colon cancer screening. Never had colonoscopy (not interested), no family history colon cancer. - Discussion today about recommendations for either Colonoscopy or Cologuard screening, benefits and risks of screening, interested in Cologuard, understands that if positive then recommendation is for diagnostic colonoscopy to follow-up. - Ordered Cologuard today  [facility] 3-5 [facility] after  Perimenopausal with early symptoms, can be unpredictable, diagnosis only after 12 months no period.   Meds ordered this encounter  Medications   citalopram (CELEXA) 10 MG tablet    Sig: Take 1 tablet (10 mg total) by mouth daily.    Dispense:  90 tablet    Refill:  3    DX Code Needed  F06.4, F41.0   Orders Placed This Encounter  Procedures   COMPLETE METABOLIC PANEL WITH GFR   CBC with Differential/Platelet   Lipid panel    Order Specific Question:   Has the patient fasted?    Answer:   Yes   Hemoglobin A1c   T4, free   TSH   Cologuard    Ambulatory referral to Obstetrics / Gynecology    Referral Priority:   Routine    Referral Type:   Consultation    Referral Reason:   Specialty Services Required    Requested Specialty:   Obstetrics and Gynecology  Number of Visits Requested:   1      Follow up plan: Return in about 1 year (around 02/23/2023) for 1 year Annual Physical AM fasting lab after.  Nobie Putnam, DO [facility] 02/22/2022, 8:16 AM

## 2022-02-22 NOTE — Assessment & Plan Note (Signed)
Very mild elevated. Not in range of concern Asymptomatic Follow-up yearly TSH panel

## 2022-02-22 NOTE — Assessment & Plan Note (Signed)
Stable, chronic anxiety >20 years with infrequent panic attacks, without other complications Not consistent with GAD, or associated mood disorder. Well functioning. -GAD7: 0 / PHQ9: 0 Controlled on Celexa Med history: prior on Klonopin, Wellbutrin Not followed by Psychiatry / Psychology  Plan: 1. Reduce dose Celexa 1m daily - instead of half tab, we can add extra if need during holidays 2. Continue self management of rare panic attacks - breathing technique, no additional treatment needed 3. Consider psychology / therapy in future if needed 4. Follow-up 1 year for Annual Physical, GAD7/PHQ9

## 2022-02-23 LAB — CBC WITH DIFFERENTIAL/PLATELET
Absolute Monocytes: 423 cells/uL (ref 200–950)
Basophils Absolute: 28 cells/uL (ref 0–200)
Basophils Relative: 0.6 %
Eosinophils Absolute: 61 cells/uL (ref 15–500)
Eosinophils Relative: 1.3 %
HCT: 38.8 % (ref 35.0–45.0)
Hemoglobin: 12.7 g/dL (ref 11.7–15.5)
Lymphs Abs: 1725 cells/uL (ref 850–3900)
MCH: 31.4 pg (ref 27.0–33.0)
MCHC: 32.7 g/dL (ref 32.0–36.0)
MCV: 95.8 fL (ref 80.0–100.0)
MPV: 12.8 fL — ABNORMAL HIGH (ref 7.5–12.5)
Monocytes Relative: 9 %
Neutro Abs: 2463 cells/uL (ref 1500–7800)
Neutrophils Relative %: 52.4 %
Platelets: 182 10*3/uL (ref 140–400)
RBC: 4.05 10*6/uL (ref 3.80–5.10)
RDW: 11.8 % (ref 11.0–15.0)
Total Lymphocyte: 36.7 %
WBC: 4.7 10*3/uL (ref 3.8–10.8)

## 2022-02-23 LAB — COMPLETE METABOLIC PANEL WITH GFR
AG Ratio: 1.6 (calc) (ref 1.0–2.5)
ALT: 7 U/L (ref 6–29)
AST: 13 U/L (ref 10–35)
Albumin: 4.1 g/dL (ref 3.6–5.1)
Alkaline phosphatase (APISO): 38 U/L (ref 31–125)
BUN: 9 mg/dL (ref 7–25)
CO2: 27 mmol/L (ref 20–32)
Calcium: 9.4 mg/dL (ref 8.6–10.2)
Chloride: 105 mmol/L (ref 98–110)
Creat: 0.56 mg/dL (ref 0.50–0.99)
Globulin: 2.6 g/dL (calc) (ref 1.9–3.7)
Glucose, Bld: 84 mg/dL (ref 65–99)
Potassium: 4.3 mmol/L (ref 3.5–5.3)
Sodium: 138 mmol/L (ref 135–146)
Total Bilirubin: 0.5 mg/dL (ref 0.2–1.2)
Total Protein: 6.7 g/dL (ref 6.1–8.1)
eGFR: 113 mL/min/{1.73_m2} (ref >=60)

## 2022-02-23 LAB — TSH: TSH: 5.14 mIU/L — ABNORMAL HIGH

## 2022-02-23 LAB — T4, FREE: Free T4: 0.9 ng/dL (ref 0.8–1.8)

## 2022-02-23 LAB — HEMOGLOBIN A1C
Hgb A1c MFr Bld: 5.2 % of total Hgb (ref <5.7)
Mean Plasma Glucose: 103 mg/dL
eAG (mmol/L): 5.7 mmol/L

## 2022-02-23 LAB — LIPID PANEL
Cholesterol: 200 mg/dL — ABNORMAL HIGH (ref <200)
HDL: 66 mg/dL (ref >=50)
LDL Cholesterol (Calc): 120 mg/dL (calc) — ABNORMAL HIGH
Non-HDL Cholesterol (Calc): 134 mg/dL (calc) — ABNORMAL HIGH (ref <130)
Total CHOL/HDL Ratio: 3 (calc) (ref <5.0)
Triglycerides: 58 mg/dL (ref <150)

## 2022-02-27 ENCOUNTER — Other Ambulatory Visit: Payer: Self-pay | Admitting: Family Medicine

## 2022-02-27 DIAGNOSIS — F064 Anxiety disorder due to known physiological condition: Secondary | ICD-10-CM

## 2022-02-27 NOTE — Telephone Encounter (Signed)
Medication Refill - Medication:  citalopram (CELEXA) 10 MG tablet 90 tablet 3 02/22/2022    Has the patient contacted their pharmacy? Yes.   (Agent: If no, request that the patient contact the pharmacy for the refill. If patient does not wish to contact the pharmacy document the reason why and proceed with request.) (Agent: If yes, when and what did the pharmacy advise?) Pt is upset with pharmacy/will not let her have med above as they state her birthdate is incorrect, she is so upset she wants to change pharmacies to  below. [facility] resend to [facility] (with phone number or street name):  [place] [address], [address] Phone:  [phone]  Fax:  [phone]     Has the patient been seen for an appointment in the last year OR does the patient have an upcoming appointment? Yes.    Agent: Please be advised that RX refills may take up to 3 business days. We ask that you follow-up with your pharmacy.

## 2022-02-28 MED ORDER — CITALOPRAM HYDROBROMIDE 10 MG PO TABS: 10.0000 mg | ORAL_TABLET | Freq: Every day | ORAL | 3 refills | Status: DC

## 2022-02-28 NOTE — Telephone Encounter (Signed)
Sent to different pharmacy  Requested Prescriptions  Pending Prescriptions Disp Refills  . citalopram (CELEXA) 10 MG tablet 90 tablet 3    Sig: Take 1 tablet (10 mg total) by mouth daily.     Psychiatry:  Antidepressants - SSRI Passed - 02/27/2022  3:47 PM      Passed - Valid encounter within last 6 months    Recent Outpatient Visits          6 days ago Annual physical exam   [place], DO   2 months ago Bursitis of right shoulder   [place], DO   1 year ago Annual physical exam   [facility] Olin Hauser, DO   2 years ago Annual physical exam   [facility] Olin Hauser, DO   2 years ago Ganglion cyst of flexor tendon sheath of finger of right hand   [facility] Merrilyn Puma, Jerrel Ivory, NP      Future Appointments            In 1 year [place], Devonne Doughty, [facility], [facility]

## 2022-04-04 LAB — COLOGUARD: COLOGUARD: NEGATIVE

## 2022-04-05 ENCOUNTER — Ambulatory Visit
Admission: EM | Admit: 2022-04-05 | Discharge: 2022-04-05 | Disposition: A | Discharge status: Home / Self Care | Payer: BC Managed Care – PPO | Attending: Emergency Medicine | Admitting: Emergency Medicine

## 2022-04-05 DIAGNOSIS — H10501 Unspecified blepharoconjunctivitis, right eye: Secondary | ICD-10-CM

## 2022-04-05 MED ORDER — MOXIFLOXACIN HCL 0.5 % OP SOLN: 1.0000 [drp] | Freq: Three times a day (TID) | OPHTHALMIC | 0 refills | Status: AC

## 2022-04-05 NOTE — Discharge Instructions (Signed)
Instill 1 drop of Vigamox in each eye every 8 hours for the next 7 days for treatment of your conjunctivitis.  Avoid touching your eyes as much as possible.  Wipe down all surfaces, countertops, and doorknobs after the first and second 24 hours on eyedrops.  Wash her face with a clean wash rag to remove any drainage and use a different portion of the wash rag to clean each eye so as to not reinfect yourself.  Return for reevaluation for any new or worsening symptoms.

## 2022-04-05 NOTE — ED Provider Notes (Signed)
[facility]    CSN: [id number] Arrival date & time: 04/05/22  0802      History   Chief Complaint No chief complaint on file.   HPI Sheila Clarke is a 46 y.o. female.   HPI  47 year old female here for evaluation of right eye complaint.  Patient reports that he has been experiencing right eye redness, swelling, and irritation that started yesterday.  She reports that earlier in the week she worked in her garden and some dirt got on her brow and then may have dripped in her eye with sweat.  She states that she has had a lot of tearing from the eye but she is also experiencing yellow mucousy discharge.  This morning her eye was matted shut.  She denies any itching of the eye or known sick contacts.  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      Home Medications    Prior to Admission medications   Medication Sig Start Date End Date Taking? Authorizing Provider  citalopram (CELEXA) 10 MG tablet Take 1 tablet (10 mg total) by mouth daily. 02/28/22  Yes Karamalegos, Devonne Doughty, DO  meloxicam (MOBIC) 15 MG tablet Take 1 tablet (15 mg total) by mouth daily as needed for pain. 12/14/21  Yes Karamalegos, Devonne Doughty, DO  moxifloxacin (VIGAMOX) 0.5 % ophthalmic solution Place 1 drop into both eyes 3 (three) times daily for 7 days. 04/05/22 04/12/22 Yes Margarette Canada, NP  valACYclovir (VALTREX) 1000 MG  tablet valacyclovir 1 gram tablet  TAKE TWO PILLS EVERY 12 HOURS X 1 DAY. BEGIN AT ONSET OF SYMPTOMS.   Yes [provider]    Family History Family History  Problem Relation Age of Onset   Congestive Heart Failure Mother    Asthma Mother    Cancer Father        skin, lymphoma   Heart disease Father    Skin cancer Father    Lymphoma Father    Breast cancer Neg Hx     Social History Social History   Tobacco Use   Smoking status: Never   Smokeless tobacco: Never  Vaping Use   Vaping Use: Never used  Substance Use Topics   Alcohol use: No   Drug use: No     Allergies   Patient has no known allergies.   Review of Systems Review of Systems  Constitutional:  Negative for fever.  Eyes:  Positive for pain, discharge and redness. Negative for photophobia, itching and visual disturbance.     Physical Exam Triage Vital Signs ED Triage Vitals  Enc Vitals Group     BP --      Pulse --      Resp --      Temp --      Temp src --      SpO2 --  Weight 04/05/22 0812 127 lb (57.6 kg)     Height 04/05/22 0812 5' 1"  (1.549 m)     Head Circumference --      Peak Flow --      Pain Score 04/05/22 0811 7     Pain Loc --      Pain Edu? --      Excl. in [place]? --    No data found.  Updated Vital Signs BP 112/86 (BP Location: Left Arm)   Pulse 76   Temp 98.4 F (36.9 C) (Oral)   Ht 5' 1"  (1.549 m)   Wt 127 lb (57.6 kg)   LMP 04/03/2022   SpO2 100%   BMI 24.00 kg/m   Visual Acuity Right Eye Distance: 20/25 corrected Left Eye Distance: 20/30 corrected Bilateral Distance: 20/20 corrected  Right Eye Near:   Left Eye Near:    Bilateral Near:     Physical Exam Vitals and nursing note reviewed.  Constitutional:      Appearance: Normal appearance. She is not ill-appearing.  HENT:     Head: Normocephalic and atraumatic.  Eyes:     General: No scleral icterus.       Right eye: Discharge present.     Extraocular Movements: Extraocular movements intact.      Pupils: Pupils are equal, round, and reactive to light.  Skin:    General: Skin is warm and dry.     Capillary Refill: Capillary refill takes less than 2 seconds.     Findings: Erythema present.  Neurological:     General: No focal deficit present.     Mental Status: She is alert and oriented to person, place, and time.  Psychiatric:        Mood and Affect: Mood normal.        Behavior: Behavior normal.        Thought Content: Thought content normal.        Judgment: Judgment normal.      UC Treatments / Results  Labs (all labs ordered are listed, but only abnormal results are displayed) Labs Reviewed - No data to display  EKG   Radiology No results found.  Procedures Procedures (including critical care time)  Medications Ordered in UC Medications - No data to display  Initial Impression / Assessment and Plan / UC Course  I have reviewed the triage vital signs and the nursing notes.  Pertinent labs & imaging results that were available during my care of the patient were reviewed by me and considered in my medical decision making (see chart for details).  Patient is a pleasant, nontoxic-appearing 46 year old female here for evaluation of 1 day worth of right eye redness, swelling to her eyelids, and yellow mucoid discharge.  Patient does not wear contacts but she does wear glasses.  She denies any sick contacts.  She denies any pain with EOM but states that it feels irritated.  No potential for foreign bodies.  On exam patient has normal red light reflex in both eyes.  Her pupils equal round reactive and her EOM is intact.  There is erythema and injection of the bulbar and labral conjunctiva of the right eye.  The right upper eyelid is also erythematous with mild edema.  No fluctuance induration noted.  It is mildly tender to touch on the upper eyelid but the periorbital area is nontender.  Patient exam is consistent with blepharal conjunctivitis.  I will treat her with  Vigamox 3 times daily x7 days.  She states that she did notice some mucoid discharge in her left inner canthus.  This is not evident to me on exam.  I have instructed her to treat both eyes.  Work note provided.  ER return precautions reviewed.   Final Clinical Impressions(s) / UC Diagnoses   Final diagnoses:  Blepharoconjunctivitis of right eye, unspecified blepharoconjunctivitis type     Discharge Instructions      Instill 1 drop of Vigamox in each eye every 8 hours for the next 7 days for treatment of your conjunctivitis.  Avoid touching your eyes as much as possible.  Wipe down all surfaces, countertops, and doorknobs after the first and second 24 hours on eyedrops.  Wash her face with a clean wash rag to remove any drainage and use a different portion of the wash rag to clean each eye so as to not reinfect yourself.  Return for reevaluation for any new or worsening symptoms.      ED Prescriptions     Medication Sig Dispense Auth. Provider   moxifloxacin (VIGAMOX) 0.5 % ophthalmic solution Place 1 drop into both eyes 3 (three) times daily for 7 days. 3 mL Margarette Canada, NP      PDMP not reviewed this encounter.   Margarette Canada, NP 04/05/22 [phone]

## 2022-04-05 NOTE — ED Triage Notes (Signed)
Patient presents to UC for Right eye problem.   Started yesterday -- redness and swollen.

## 2022-04-29 ENCOUNTER — Ambulatory Visit (INDEPENDENT_AMBULATORY_CARE_PROVIDER_SITE_OTHER): Payer: BC Managed Care – PPO | Admitting: Family Medicine

## 2022-04-29 ENCOUNTER — Encounter: Payer: Self-pay | Admitting: Family Medicine

## 2022-04-29 VITALS — BP 121/67 | HR 82 | Temp 98.3°F | Ht 61.0 in | Wt 128.0 lb

## 2022-04-29 DIAGNOSIS — J9801 Acute bronchospasm: Secondary | ICD-10-CM

## 2022-04-29 DIAGNOSIS — J0110 Acute frontal sinusitis, unspecified: Secondary | ICD-10-CM | POA: Diagnosis not present

## 2022-04-29 MED ORDER — ALBUTEROL SULFATE HFA 108 (90 BASE) MCG/ACT IN AERS: 2.0000 | INHALATION_SPRAY | RESPIRATORY_TRACT | 0 refills | Status: DC | PRN

## 2022-04-29 MED ORDER — PREDNISONE 10 MG PO TABS: ORAL_TABLET | ORAL | 0 refills | Status: DC

## 2022-04-29 MED ORDER — AMOXICILLIN-POT CLAVULANATE 875-125 MG PO TABS: 1.0000 | ORAL_TABLET | Freq: Two times a day (BID) | ORAL | 0 refills | Status: DC

## 2022-04-29 NOTE — Patient Instructions (Addendum)
Thank you for coming to the office today.  Likely initially viral infection Could have settled into a possible deeper sinus vs pneumonia vs bronchitis.  Start the Augmentin antibiotic  Use the Prednisone taper for cough / pain / swelling, should help reduce the night time cough and help throat.  Use Albuterol rescue inhaler 1-2 puffs only if bad coughing spell or wheezing, every 4-6 hour range, can cause you to feel jittery and anxious at times. It should slow down the breathing.  If not improved 72 hours or so, can do Chest X-ray.   Please schedule a Follow-up Appointment to: Return if symptoms worsen or fail to improve.  If you have any other questions or concerns, please feel free to call the office or send a message through MyChart. You may also schedule an earlier appointment if necessary.  Additionally, you may be receiving a survey about your experience at our office within a few days to 1 week by e-mail or mail. We value your feedback.  Saralyn Pilar, DO [facility], [place]

## 2022-04-29 NOTE — Progress Notes (Signed)
Subjective:    Patient ID: Sheila Clarke, female    DOB: January 22, 1976, 46 y.o.   MRN: [id number]  Sheila Clarke is a 46 y.o. female presenting on 04/29/2022 for chest congestion and Sore Throat   HPI  Sinusitis, post viral 6 week ago onset with some viral symptoms She has had issues with chest congestion productive cough now after previous illness symptoms. She questioned if it was more allergies environmental, now it is worse. Now worse to point of pain with swallowing even liquids. Cough is dry and irritated with chest congestion. Non productive. Seems to be more second sickening with improved then worse with recurrence now. She has had episodic chills and fever over past weekend. Night time is worse with temperature. Tylenol PRN, cold and flu.      04/29/2022    8:34 AM 02/22/2022   12:05 PM 02/19/2021    8:15 AM  Depression screen PHQ 2/9  Decreased Interest 0 0 0  Down, Depressed, Hopeless 0 0 0  PHQ - 2 Score 0 0 0  Altered sleeping 0 0 0  Tired, decreased energy 0 0 0  Change in appetite 0 0 0  Feeling bad or failure about yourself  0 0 0  Trouble concentrating 0 0 0  Moving slowly or fidgety/restless 0 0 0  Suicidal thoughts 0 0 0  PHQ-9 Score 0 0 0  Difficult doing work/chores Not difficult at all Not difficult at all Not difficult at all    Social History   Tobacco Use   Smoking status: Never   Smokeless tobacco: Never  Vaping Use   Vaping Use: Never used  Substance Use Topics   Alcohol use: No   Drug use: No    Review of Systems Per HPI unless specifically indicated above     Objective:    BP 121/67   Pulse 82   Temp 98.3 F (36.8 C) (Oral)   Ht $R'5\' 1"'Ez$  (1.549 m)   Wt 128 lb (58.1 kg)   LMP 04/03/2022   SpO2 98%   BMI 24.19 kg/m   Wt Readings from Last 3 Encounters:  04/29/22 128 lb (58.1 kg)  04/05/22 127 lb (57.6 kg)  02/22/22 125 lb 3.2 oz (56.8 kg)    Physical Exam Vitals and nursing note reviewed.  Constitutional:      General:  She is not in acute distress.    Appearance: She is well-developed. She is not diaphoretic.     Comments: Well-appearing, comfortable, cooperative  HENT:     Head: Normocephalic and atraumatic.     Right Ear: Tympanic membrane, ear canal and external ear normal. There is no impacted cerumen.     Left Ear: Tympanic membrane, ear canal and external ear normal. There is no impacted cerumen.     Nose: Congestion present.     Mouth/Throat:     Pharynx: Posterior oropharyngeal erythema (generalized symmetrical) present. No oropharyngeal exudate.  Eyes:     General:        Right eye: No discharge.        Left eye: No discharge.     Conjunctiva/sclera: Conjunctivae normal.  Neck:     Thyroid: No thyromegaly.  Cardiovascular:     Rate and Rhythm: Normal rate and regular rhythm.     Heart sounds: Normal heart sounds. No murmur heard. Pulmonary:     Effort: Pulmonary effort is normal. No respiratory distress.     Breath sounds: Wheezing present. No rales.  Musculoskeletal:        General: Normal range of motion.     Cervical back: Normal range of motion and neck supple.  Lymphadenopathy:     Cervical: No cervical adenopathy.  Skin:    General: Skin is warm and dry.     Findings: No erythema or rash.  Neurological:     Mental Status: She is alert and oriented to person, place, and time.  Psychiatric:        Behavior: Behavior normal.     Comments: Well groomed, good eye contact, normal speech and thoughts    Results for orders placed or performed in visit on 02/22/22  COMPLETE METABOLIC PANEL WITH GFR  Result Value Ref Range   Glucose, Bld 84 65 - 99 mg/dL   BUN 9 7 - 25 mg/dL   Creat 4.06 4.29 - 4.24 mg/dL   eGFR 998 > OR = 60 SK/LOY/4.97C5   BUN/Creatinine Ratio NOT APPLICABLE 6 - 22 (calc)   Sodium 138 135 - 146 mmol/L   Potassium 4.3 3.5 - 5.3 mmol/L   Chloride 105 98 - 110 mmol/L   CO2 27 20 - 32 mmol/L   Calcium 9.4 8.6 - 10.2 mg/dL   Total Protein 6.7 6.1 - 8.1 g/dL    Albumin 4.1 3.6 - 5.1 g/dL   Globulin 2.6 1.9 - 3.7 g/dL (calc)   AG Ratio 1.6 1.0 - 2.5 (calc)   Total Bilirubin 0.5 0.2 - 1.2 mg/dL   Alkaline phosphatase (APISO) 38 31 - 125 U/L   AST 13 10 - 35 U/L   ALT 7 6 - 29 U/L  CBC with Differential/Platelet  Result Value Ref Range   WBC 4.7 3.8 - 10.8 Thousand/uL   RBC 4.05 3.80 - 5.10 Million/uL   Hemoglobin 12.7 11.7 - 15.5 g/dL   HCT 84.1 26.2 - 19.9 %   MCV 95.8 80.0 - 100.0 fL   MCH 31.4 27.0 - 33.0 pg   MCHC 32.7 32.0 - 36.0 g/dL   RDW 94.7 82.9 - 76.5 %   Platelets 182 140 - 400 Thousand/uL   MPV 12.8 (H) 7.5 - 12.5 fL   Neutro Abs 2,463 1,500 - 7,800 cells/uL   Lymphs Abs 1,725 850 - 3,900 cells/uL   Absolute Monocytes 423 200 - 950 cells/uL   Eosinophils Absolute 61 15 - 500 cells/uL   Basophils Absolute 28 0 - 200 cells/uL   Neutrophils Relative % 52.4 %   Total Lymphocyte 36.7 %   Monocytes Relative 9.0 %   Eosinophils Relative 1.3 %   Basophils Relative 0.6 %  Lipid panel  Result Value Ref Range   Cholesterol 200 (H) <200 mg/dL   HDL 66 > OR = 50 mg/dL   Triglycerides 58 <022 mg/dL   LDL Cholesterol (Calc) 120 (H) mg/dL (calc)   Total CHOL/HDL Ratio 3.0 <5.0 (calc)   Non-HDL Cholesterol (Calc) 134 (H) <130 mg/dL (calc)  Hemoglobin S6Z  Result Value Ref Range   Hgb A1c MFr Bld 5.2 <5.7 % of total Hgb   Mean Plasma Glucose 103 mg/dL   eAG (mmol/L) 5.7 mmol/L  T4, free  Result Value Ref Range   Free T4 0.9 0.8 - 1.8 ng/dL  TSH  Result Value Ref Range   TSH 5.14 (H) mIU/L  Cologuard  Result Value Ref Range   COLOGUARD Negative Negative      Assessment & Plan:   Problem List Items Addressed This Visit   None Visit Diagnoses  Acute non-recurrent frontal sinusitis    -  Primary   Relevant Medications   amoxicillin-clavulanate (AUGMENTIN) 875-125 MG tablet   albuterol (VENTOLIN HFA) 108 (90 Base) MCG/ACT inhaler   predniSONE (DELTASONE) 10 MG tablet   Cough due to bronchospasm       Relevant  Medications   albuterol (VENTOLIN HFA) 108 (90 Base) MCG/ACT inhaler   predniSONE (DELTASONE) 10 MG tablet       Likely initially viral infection Could have settled into a possible deeper sinus vs pneumonia vs bronchitis.  Now she has bronchospasm with wheezing on exam, but does not carry history of asthma or COPD.  Start the Augmentin antibiotic  Use the Prednisone taper for cough / pain / swelling, should help reduce the night time cough and help throat.  Use Albuterol rescue inhaler 1-2 puffs only if bad coughing spell or wheezing, every 4-6 hour range, can cause you to feel jittery and anxious at times. It should slow down the breathing.  If not improved 72 hours or so, can do Chest X-ray.   Meds ordered this encounter  Medications   amoxicillin-clavulanate (AUGMENTIN) 875-125 MG tablet    Sig: Take 1 tablet by mouth 2 (two) times daily.    Dispense:  20 tablet    Refill:  0   albuterol (VENTOLIN HFA) 108 (90 Base) MCG/ACT inhaler    Sig: Inhale 2 puffs into the lungs every 4 (four) hours as needed for wheezing or shortness of breath (cough).    Dispense:  1 each    Refill:  0   predniSONE (DELTASONE) 10 MG tablet    Sig: Take 6 tabs with breakfast Day 1, 5 tabs Day 2, 4 tabs Day 3, 3 tabs Day 4, 2 tabs Day 5, 1 tab Day 6.    Dispense:  21 tablet    Refill:  0      Follow up plan: Return if symptoms worsen or fail to improve.   Nobie Putnam, [facility] 04/29/2022, 9:05 AM

## 2022-05-12 ENCOUNTER — Other Ambulatory Visit: Payer: Self-pay | Admitting: Family Medicine

## 2022-05-12 DIAGNOSIS — J9801 Acute bronchospasm: Secondary | ICD-10-CM

## 2022-05-12 DIAGNOSIS — J0110 Acute frontal sinusitis, unspecified: Secondary | ICD-10-CM

## 2022-05-13 NOTE — Telephone Encounter (Signed)
Requested medication (s) are due for refill today: yes  Requested medication (s) are on the active medication list:yes  Last refill:  04/29/22  Future visit scheduled:yes  Notes to clinic:  Unable to refill per protocol, last refill by provider on 04/29/22 for 6.7g. Pt may needed refill, routing for approval.     Requested Prescriptions  Pending Prescriptions Disp Refills   albuterol (VENTOLIN HFA) 108 (90 Base) MCG/ACT inhaler [Pharmacy Med Name: ALBUTEROL HFA INH (200 PUFFS) 6.7GM] 6.7 g     Sig: INHALE 2 PUFFS INTO THE LUNGS EVERY 4 HOURS AS NEEDED FOR WHEEZING OR SHORTNESS OF BREATH OR COUGH     Pulmonology:  Beta Agonists 2 Passed - 05/12/2022  3:20 AM      Passed - Last BP in normal range    BP Readings from Last 1 Encounters:  04/29/22 121/67         Passed - Last Heart Rate in normal range    Pulse Readings from Last 1 Encounters:  04/29/22 82         Passed - Valid encounter within last 12 months    Recent Outpatient Visits           2 weeks ago Acute non-recurrent frontal sinusitis   [facility] [place], Netta Neat, DO   2 months ago Annual physical exam   [facility] [place], Netta Neat, DO   5 months ago Bursitis of right shoulder   [facility] Smitty Cords, DO   1 year ago Annual physical exam   [facility] Smitty Cords, DO   2 years ago Annual physical exam   [facility] Smitty Cords, DO       Future Appointments             In 9 months Althea Charon, Netta Neat, DO [facility], [facility]

## 2022-10-05 ENCOUNTER — Ambulatory Visit
Admission: EM | Admit: 2022-10-05 | Discharge: 2022-10-05 | Disposition: A | Discharge status: Other Acute Inpatient Hospital | Payer: BC Managed Care – PPO

## 2022-10-06 ENCOUNTER — Ambulatory Visit
Admission: RE | Admit: 2022-10-06 | Discharge: 2022-10-06 | Disposition: A | Discharge status: Home / Self Care | Payer: BC Managed Care – PPO | Source: Ambulatory Visit | Attending: Family Medicine | Admitting: Family Medicine

## 2022-10-06 VITALS — BP 122/86 | HR 90 | Temp 98.3°F | Resp 16

## 2022-10-06 DIAGNOSIS — J0110 Acute frontal sinusitis, unspecified: Secondary | ICD-10-CM | POA: Diagnosis not present

## 2022-10-06 DIAGNOSIS — J40 Bronchitis, not specified as acute or chronic: Secondary | ICD-10-CM | POA: Diagnosis not present

## 2022-10-06 DIAGNOSIS — J9801 Acute bronchospasm: Secondary | ICD-10-CM | POA: Diagnosis not present

## 2022-10-06 MED ORDER — AMOXICILLIN-POT CLAVULANATE 875-125 MG PO TABS: 1.0000 | ORAL_TABLET | Freq: Two times a day (BID) | ORAL | 0 refills | Status: DC

## 2022-10-06 MED ORDER — PREDNISONE 10 MG PO TABS: ORAL_TABLET | ORAL | 0 refills | Status: DC

## 2022-10-06 NOTE — ED Provider Notes (Signed)
[facility]    CSN: [id number] Arrival date & time: 10/06/22  1400      History   Chief Complaint Chief Complaint  Patient presents with   Cough   Chills    HPI Sheila Clarke is a 46 y.o. female.   HPI   Sheila Clarke presents for cough with chest congestion for 9 days.  Took some NyQuil, Benadryl and Mucinex.  Started having chills and diarrhea that resolved. Has burning sinuses and eyes.  Took Tylenol last night for headache that resolved.  No history of smoking, asthma or COPD.  States that her parents smoked when she was younger.  Fever: no Chills: yes Sore throat: no   Cough: yes Sputum: yes yellow Nasal congestion : yes Rhinorrhea: yes Myalgias: no Appetite: decreased Hydration: normal  Abdominal pain: no Nausea: no Vomiting: no Diarrhea: yes Sleep disturbance: due to family stressors Headache: yes     Past Medical History:  Diagnosis Date   Panic attacks     Patient Active Problem List   Diagnosis Date Noted   Elevated TSH 02/20/2021   Elevated LDL cholesterol level 02/20/2021   Right lateral epicondylitis 12/20/2019   Pain of right midfoot 08/13/2017   Pes cavus 08/13/2017   Primary osteoarthritis involving multiple joints 08/13/2017   Skin lesion of back 12/25/2016   History of skin cancer 12/25/2016   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      Home Medications    Prior to Admission medications   Medication Sig Start Date End Date Taking? Authorizing Provider  albuterol (VENTOLIN HFA) 108 (90 Base) MCG/ACT inhaler INHALE 2 PUFFS INTO THE LUNGS EVERY 4 HOURS AS NEEDED FOR WHEEZING OR SHORTNESS OF BREATH OR COUGH 05/13/22  Yes Karamalegos, Netta Neat, DO  citalopram (CELEXA) 10 MG tablet Take 1 tablet (10 mg total) by mouth daily. 02/28/22  Yes Karamalegos, Netta Neat,  DO  amoxicillin-clavulanate (AUGMENTIN) 875-125 MG tablet Take 1 tablet by mouth 2 (two) times daily for 7 days. 10/06/22 10/13/22  Katha Cabal, DO  meloxicam (MOBIC) 15 MG tablet Take 1 tablet (15 mg total) by mouth daily as needed for pain. 12/14/21   Karamalegos, Netta Neat, DO  predniSONE (DELTASONE) 10 MG tablet Take 6 tabs with breakfast Day 1, 5 tabs Day 2, 4 tabs Day 3, 3 tabs Day 4, 2 tabs Day 5, 1 tab Day 6. 10/06/22   Kaileen Bronkema, DO  valACYclovir (VALTREX) 1000 MG tablet valacyclovir 1 gram tablet  TAKE TWO PILLS EVERY 12 HOURS X 1 DAY. BEGIN AT ONSET OF SYMPTOMS.    [provider]    Family History Family History  Problem Relation Age of Onset   Congestive Heart Failure Mother    Asthma Mother    Cancer Father        skin, lymphoma   Heart disease Father    Skin cancer Father    Lymphoma Father    Breast cancer Neg Hx     Social History Social History   Tobacco Use   Smoking status: Never   Smokeless tobacco: Never  Vaping Use   Vaping Use: Never used  Substance Use Topics   Alcohol use: No   Drug use: No     Allergies   Patient has no known allergies.   Review of  Systems Review of Systems: negative unless otherwise stated in HPI.      Physical Exam Triage Vital Signs ED Triage Vitals  Enc Vitals Group     BP 10/06/22 1422 122/86     Pulse Rate 10/06/22 1422 90     Resp 10/06/22 1422 16     Temp 10/06/22 1422 98.3 F (36.8 C)     Temp Source 10/06/22 1422 Oral     SpO2 10/06/22 1422 99 %     Weight --      Height --      Head Circumference --      Peak Flow --      Pain Score 10/06/22 1420 0     Pain Loc --      Pain Edu? --      Excl. in GC? --    No data found.  Updated Vital Signs BP 122/86 (BP Location: Left Arm)   Pulse 90   Temp 98.3 F (36.8 C) (Oral)   Resp 16   LMP 09/15/2022 (Approximate)   SpO2 99%   Visual Acuity Right Eye Distance:   Left Eye Distance:   Bilateral Distance:    Right Eye Near:    Left Eye Near:    Bilateral Near:     Physical Exam GEN:     alert, non-toxic appearing female in no distress    HENT:  mucus membranes moist, oropharyngeal without lesions, erythem or exudate,no tonsillar hypertrophy, clear nasal discharge, erythematous and edematous turbinates bilaterally, TM normal bilaterally EYES:   pupils equal and reactive, no scleral injection or discharge NECK:  normal ROM, no meningismus   RESP:  no increased work of breathing, rhonchi that clear with cough CVS:   regular rate and rhythm Skin:   warm and dry    UC Treatments / Results  Labs (all labs ordered are listed, but only abnormal results are displayed) Labs Reviewed - No data to display  EKG   Radiology No results found.  Procedures Procedures (including critical care time)  Medications Ordered in UC Medications - No data to display  Initial Impression / Assessment and Plan / UC Course  I have reviewed the triage vital signs and the nursing notes.  Pertinent labs & imaging results that were available during my care of the patient were reviewed by me and considered in my medical decision making (see chart for details).       Pt is a 46 y.o. female who presents for 1-2 weeks and is not improving.  Kynzlie is  afebrile here without recent antipyretics. Satting well on room air. Overall pt is  non-toxic appearing, well hydrated, without respiratory distress. Pulmonary exam is remarkable for rhonchi that clear with cough.  After shared decision making, we will not pursue chest x-ray at this time as it currently would not change management.  COVID  and influenza testing deferred due to length of symptoms.  Treat acute bronchitis with steroids and antibiotics as below.  Continue to use albuterol as needed.   Typical duration of symptoms discussed. Return and ED precautions given and patient voiced understanding.   Discussed MDM, treatment plan and plan for follow-up with patient who agrees with  plan.      Final Clinical Impressions(s) / UC Diagnoses   Final diagnoses:  Bronchitis     Discharge Instructions      I am concerned that your original viral infection has started to become a bacterial infection.  We will treat this  with steroids and antibiotics as discussed.  Stop by the pharmacy to pick up these prescriptions.    If your symptoms or not improving after you complete your course of medications, return or follow-up with your primary care provider.     ED Prescriptions     Medication Sig Dispense Auth. Provider   amoxicillin-clavulanate (AUGMENTIN) 875-125 MG tablet Take 1 tablet by mouth 2 (two) times daily for 7 days. 14 tablet Mohini Heathcock, DO   predniSONE (DELTASONE) 10 MG tablet Take 6 tabs with breakfast Day 1, 5 tabs Day 2, 4 tabs Day 3, 3 tabs Day 4, 2 tabs Day 5, 1 tab Day 6. 21 tablet Lainey Nelson, DO      PDMP not reviewed this encounter.   Katha Cabal, DO 10/06/22 1526

## 2022-10-06 NOTE — Discharge Instructions (Signed)
I am concerned that your original viral infection has started to become a bacterial infection.  We will treat this with steroids and antibiotics as discussed.  Stop by the pharmacy to pick up these prescriptions.    If your symptoms or not improving after you complete your course of medications, return or follow-up with your primary care provider.

## 2022-10-06 NOTE — ED Triage Notes (Signed)
Pt presents with a cough, chest congestion and chills x 9 days. For the past 2 days her eyes and nose has been burning.

## 2022-10-11 ENCOUNTER — Ambulatory Visit: Payer: BC Managed Care – PPO | Admitting: Family Medicine

## 2022-10-11 ENCOUNTER — Encounter: Payer: Self-pay | Admitting: Family Medicine

## 2022-10-11 VITALS — BP 122/84 | HR 80 | Ht 61.0 in | Wt 130.8 lb

## 2022-10-11 DIAGNOSIS — J9801 Acute bronchospasm: Secondary | ICD-10-CM | POA: Diagnosis not present

## 2022-10-11 DIAGNOSIS — R058 Other specified cough: Secondary | ICD-10-CM | POA: Diagnosis not present

## 2022-10-11 DIAGNOSIS — J0110 Acute frontal sinusitis, unspecified: Secondary | ICD-10-CM | POA: Diagnosis not present

## 2022-10-11 MED ORDER — LEVOFLOXACIN 500 MG PO TABS: 500.0000 mg | ORAL_TABLET | Freq: Every day | ORAL | 0 refills | Status: DC

## 2022-10-11 MED ORDER — IPRATROPIUM BROMIDE 0.06 % NA SOLN: 2.0000 | Freq: Four times a day (QID) | NASAL | 0 refills | Status: DC

## 2022-10-11 MED ORDER — PREDNISONE 20 MG PO TABS: ORAL_TABLET | ORAL | 0 refills | Status: DC

## 2022-10-11 MED ORDER — GUAIFENESIN-CODEINE 100-10 MG/5ML PO SYRP: 5.0000 mL | ORAL_SOLUTION | Freq: Four times a day (QID) | ORAL | 0 refills | Status: DC | PRN

## 2022-10-11 NOTE — Patient Instructions (Addendum)
Thank you for coming to the office today.  Likely viral syndrome Could have been COVID Flu or RSV - unsure at this time, too late to test. Switch from previous treatment over to new rx plan.  Start taking Levaquin antibiotic 500mg  daily x 7 days  Switch to new prednisone steroid taper  Cough syrup at night.  Start Atrovent nasal spray decongestant 2 sprays in each nostril up to 4 times daily for 7 days  Use Albuterol inhaler every 4-6 hours  Use Mucinx for 1 week and Sudafed as needed.  Chest X-ray ordered - walk in if you prefer.  [facility] at [facility] Address: [address] [place], [place] [address] Phone: [phone]  Please schedule a Follow-up Appointment to: Return if symptoms worsen or fail to improve.  If you have any other questions or concerns, please feel free to call the office or send a message through [facility]. You may also schedule an earlier appointment if necessary.  Additionally, you may be receiving a survey about your experience at our office within a few days to 1 week by e-mail or mail. We value your feedback.  Nobie Putnam, DO [facility]

## 2022-10-11 NOTE — Progress Notes (Signed)
Subjective:    Patient ID: Sheila Clarke, female    DOB: 1975/10/31, 47 y.o.   MRN: [id number]  Sheila Clarke is a 47 y.o. female presenting on 10/11/2022 for Hospitalization Follow-up   HPI  Urgent FOLLOW-UP VISIT  Hospital/Location: [facility] Date of UC Visit: 10/06/22  Reason for Presenting to UC: URI Bronchitis  FOLLOW-UP - ED provider note and record have been reviewed - Patient presents today about 12 days after recent UC visit. Brief summary of recent course, patient had symptoms of cold congestion cough and chills unsure fever for 9 days prior to presenting to UC on 12/31, testing in UC was not done for any viral infection or CXR, treated with Augmentin and Prednisone.  Symptoms not resolved but not worse. Still has thicker productive cough. Some wheezing coarse breathing. But not dyspnea. Has albuterol inhaler for AS NEEDED use  I have reviewed the discharge medication list, and have reconciled the current and discharge medications today.       04/29/2022    8:34 AM 02/22/2022   12:05 PM 02/19/2021    8:15 AM  Depression screen PHQ 2/9  Decreased Interest 0 0 0  Down, Depressed, Hopeless 0 0 0  PHQ - 2 Score 0 0 0  Altered sleeping 0 0 0  Tired, decreased energy 0 0 0  Change in appetite 0 0 0  Feeling bad or failure about yourself  0 0 0  Trouble concentrating 0 0 0  Moving slowly or fidgety/restless 0 0 0  Suicidal thoughts 0 0 0  PHQ-9 Score 0 0 0  Difficult doing work/chores Not difficult at all Not difficult at all Not difficult at all    Social History   Tobacco Use   Smoking status: Never   Smokeless tobacco: Never  Vaping Use   Vaping Use: Never used  Substance Use Topics   Alcohol use: No   Drug use: No    Review of Systems Per HPI unless specifically indicated above     Objective:    BP 122/84   Pulse 80   Ht 5\' 1"  (1.549 m)   Wt 130 lb 12.8 oz (59.3 kg)   LMP 09/15/2022 (Approximate)   SpO2 99%   BMI 24.71 kg/m    Wt Readings from Last 3 Encounters:  10/11/22 130 lb 12.8 oz (59.3 kg)  04/29/22 128 lb (58.1 kg)  04/05/22 127 lb (57.6 kg)    Physical Exam Vitals and nursing note reviewed.  Constitutional:      General: She is not in acute distress.    Appearance: She is well-developed. She is not diaphoretic.     Comments: Well-appearing, comfortable, cooperative  HENT:     Head: Normocephalic and atraumatic.  Eyes:     General:        Right eye: No discharge.        Left eye: No discharge.     Conjunctiva/sclera: Conjunctivae normal.  Neck:     Thyroid: No thyromegaly.  Cardiovascular:     Rate and Rhythm: Normal rate and regular rhythm.     Heart sounds: Normal heart sounds. No murmur heard. Pulmonary:     Effort: Pulmonary effort is normal. No respiratory distress.     Breath sounds: Wheezing and rhonchi present. No rales.  Musculoskeletal:        General: Normal range of motion.     Cervical back: Normal range of motion and neck supple.  Lymphadenopathy:  Cervical: No cervical adenopathy.  Skin:    General: Skin is warm and dry.     Findings: No erythema or rash.  Neurological:     Mental Status: She is alert and oriented to person, place, and time.  Psychiatric:        Behavior: Behavior normal.     Comments: Well groomed, good eye contact, normal speech and thoughts    Results for orders placed or performed in visit on 02/22/22  COMPLETE METABOLIC PANEL WITH GFR  Result Value Ref Range   Glucose, Bld 84 65 - 99 mg/dL   BUN 9 7 - 25 mg/dL   Creat 0.56 0.50 - 0.99 mg/dL   eGFR 113 > OR = 60 mL/min/1.71m2   BUN/Creatinine Ratio NOT APPLICABLE 6 - 22 (calc)   Sodium 138 135 - 146 mmol/L   Potassium 4.3 3.5 - 5.3 mmol/L   Chloride 105 98 - 110 mmol/L   CO2 27 20 - 32 mmol/L   Calcium 9.4 8.6 - 10.2 mg/dL   Total Protein 6.7 6.1 - 8.1 g/dL   Albumin 4.1 3.6 - 5.1 g/dL   Globulin 2.6 1.9 - 3.7 g/dL (calc)   AG Ratio 1.6 1.0 - 2.5 (calc)   Total Bilirubin 0.5 0.2 -  1.2 mg/dL   Alkaline phosphatase (APISO) 38 31 - 125 U/L   AST 13 10 - 35 U/L   ALT 7 6 - 29 U/L  CBC with Differential/Platelet  Result Value Ref Range   WBC 4.7 3.8 - 10.8 Thousand/uL   RBC 4.05 3.80 - 5.10 Million/uL   Hemoglobin 12.7 11.7 - 15.5 g/dL   HCT 38.8 35.0 - 45.0 %   MCV 95.8 80.0 - 100.0 fL   MCH 31.4 27.0 - 33.0 pg   MCHC 32.7 32.0 - 36.0 g/dL   RDW 11.8 11.0 - 15.0 %   Platelets 182 140 - 400 Thousand/uL   MPV 12.8 (H) 7.5 - 12.5 fL   Neutro Abs 2,463 1,500 - 7,800 cells/uL   Lymphs Abs 1,725 850 - 3,900 cells/uL   Absolute Monocytes 423 200 - 950 cells/uL   Eosinophils Absolute 61 15 - 500 cells/uL   Basophils Absolute 28 0 - 200 cells/uL   Neutrophils Relative % 52.4 %   Total Lymphocyte 36.7 %   Monocytes Relative 9.0 %   Eosinophils Relative 1.3 %   Basophils Relative 0.6 %  Lipid panel  Result Value Ref Range   Cholesterol 200 (H) <200 mg/dL   HDL 66 > OR = 50 mg/dL   Triglycerides 58 <150 mg/dL   LDL Cholesterol (Calc) 120 (H) mg/dL (calc)   Total CHOL/HDL Ratio 3.0 <5.0 (calc)   Non-HDL Cholesterol (Calc) 134 (H) <130 mg/dL (calc)  Hemoglobin A1c  Result Value Ref Range   Hgb A1c MFr Bld 5.2 <5.7 % of total Hgb   Mean Plasma Glucose 103 mg/dL   eAG (mmol/L) 5.7 mmol/L  T4, free  Result Value Ref Range   Free T4 0.9 0.8 - 1.8 ng/dL  TSH  Result Value Ref Range   TSH 5.14 (H) mIU/L  Cologuard  Result Value Ref Range   COLOGUARD Negative Negative      Assessment & Plan:   Problem List Items Addressed This Visit   None Visit Diagnoses     Acute non-recurrent frontal sinusitis    -  Primary   Relevant Medications   levofloxacin (LEVAQUIN) 500 MG tablet   predniSONE (DELTASONE) 20 MG tablet  guaiFENesin-codeine (ROBITUSSIN AC) 100-10 MG/5ML syrup   ipratropium (ATROVENT) 0.06 % nasal spray   Other Relevant Orders   DG Chest 2 View   Cough due to bronchospasm       Relevant Medications   predniSONE (DELTASONE) 20 MG tablet    guaiFENesin-codeine (ROBITUSSIN AC) 100-10 MG/5ML syrup   Post-viral cough syndrome       Relevant Medications   predniSONE (DELTASONE) 20 MG tablet   guaiFENesin-codeine (ROBITUSSIN AC) 100-10 MG/5ML syrup   ipratropium (ATROVENT) 0.06 % nasal spray       Likely viral syndrome Could have been COVID Flu or RSV - unsure at this time, too late to test. Switch from previous treatment over to new rx plan.  Significant bronchospasm and coarse breath sounds.  DC Augmentin Start taking Levaquin antibiotic 500mg  daily x 7 days  Switch to new prednisone steroid taper, DC rest of previous dose.  Codeine Cough syrup at night. To help sleep  Start Atrovent nasal spray decongestant 2 sprays in each nostril up to 4 times daily for 7 days  Use Albuterol inhaler every 4-6 hours  Use Mucinx for 1 week and Sudafed as needed.  If not improving, ordered. Chest X-ray - walk in if you prefer. [facility] Address: [address], [place], [place] Phone: [phone]  Orders Placed This Encounter  Procedures   DG Chest 2 View    Standing Status:   Future    Standing Expiration Date:   10/12/2023    Order Specific Question:   Reason for Exam (SYMPTOM  OR DIAGNOSIS REQUIRED)    Answer:   post viral syndrome bronchitis rule out pneumonia    Order Specific Question:   Is patient pregnant?    Answer:   No    Order Specific Question:   Preferred imaging location?    Answer:   12/10/2023     Meds ordered this encounter  Medications   levofloxacin (LEVAQUIN) 500 MG tablet    Sig: Take 1 tablet (500 mg total) by mouth daily. For 7 days    Dispense:  7 tablet    Refill:  0   predniSONE (DELTASONE) 20 MG tablet    Sig: Take daily with food. Start with 60mg  (3 pills) x 2 days, then reduce to 40mg  (2 pills) x 2 days, then 20mg  (1 pill) x 3 days    Dispense:  13 tablet    Refill:  0   guaiFENesin-codeine (ROBITUSSIN AC) 100-10  MG/5ML syrup    Sig: Take 5-10 mLs by mouth 4 (four) times daily as needed for cough.    Dispense:  200 mL    Refill:  0   ipratropium (ATROVENT) 0.06 % nasal spray    Sig: Place 2 sprays into both nostrils 4 (four) times daily. For up to 5-7 days then stop.    Dispense:  15 mL    Refill:  0      Follow up plan: Return if symptoms worsen or fail to improve.   Leafy Kindle, DO [facility] 10/11/2022, 9:30 AM

## 2022-10-16 ENCOUNTER — Other Ambulatory Visit: Payer: Self-pay | Admitting: Family Medicine

## 2022-10-16 DIAGNOSIS — Z1231 Encounter for screening mammogram for malignant neoplasm of breast: Secondary | ICD-10-CM

## 2022-11-08 ENCOUNTER — Ambulatory Visit
Admission: RE | Admit: 2022-11-08 | Discharge: 2022-11-08 | Disposition: A | Discharge status: Home / Self Care | Payer: BC Managed Care – PPO | Source: Ambulatory Visit | Attending: Family Medicine | Admitting: Family Medicine

## 2022-11-08 DIAGNOSIS — Z1231 Encounter for screening mammogram for malignant neoplasm of breast: Secondary | ICD-10-CM | POA: Insufficient documentation

## 2023-02-25 ENCOUNTER — Ambulatory Visit (INDEPENDENT_AMBULATORY_CARE_PROVIDER_SITE_OTHER): Payer: BC Managed Care – PPO | Admitting: Family Medicine

## 2023-02-25 ENCOUNTER — Encounter: Payer: Self-pay | Admitting: Family Medicine

## 2023-02-25 VITALS — BP 100/60 | HR 65 | Ht 61.0 in | Wt 132.0 lb

## 2023-02-25 DIAGNOSIS — Z131 Encounter for screening for diabetes mellitus: Secondary | ICD-10-CM | POA: Diagnosis not present

## 2023-02-25 DIAGNOSIS — R7989 Other specified abnormal findings of blood chemistry: Secondary | ICD-10-CM

## 2023-02-25 DIAGNOSIS — F410 Panic disorder [episodic paroxysmal anxiety] without agoraphobia: Secondary | ICD-10-CM

## 2023-02-25 DIAGNOSIS — F064 Anxiety disorder due to known physiological condition: Secondary | ICD-10-CM

## 2023-02-25 DIAGNOSIS — Z1159 Encounter for screening for other viral diseases: Secondary | ICD-10-CM

## 2023-02-25 DIAGNOSIS — M159 Polyosteoarthritis, unspecified: Secondary | ICD-10-CM

## 2023-02-25 DIAGNOSIS — Z0000 Encounter for general adult medical examination without abnormal findings: Secondary | ICD-10-CM | POA: Diagnosis not present

## 2023-02-25 DIAGNOSIS — E7800 Pure hypercholesterolemia, unspecified: Secondary | ICD-10-CM

## 2023-02-25 MED ORDER — MELOXICAM 15 MG PO TABS: 15.0000 mg | ORAL_TABLET | Freq: Every day | ORAL | 2 refills | Status: DC | PRN

## 2023-02-25 MED ORDER — CITALOPRAM HYDROBROMIDE 10 MG PO TABS: 10.0000 mg | ORAL_TABLET | Freq: Every day | ORAL | 3 refills | Status: DC

## 2023-02-25 NOTE — Progress Notes (Signed)
Subjective:    Patient ID: Sheila Clarke, female    DOB: [birth year], 47 y.o.   MRN: [id number]  Sheila Clarke is a 47 y.o. female presenting on 02/25/2023 for Annual Exam   HPI  Here for Annual Physical and Lab Orders today  Lifestyle Diet reduced portion size, smaller portions, avoiding fried foods and sweets Walking regularly when able, has been more sedentary past 1 year    Updates History of Arthritis, Joint Pain, Dupuytren's Contracture Followed by Orthopedic Dr Hyacinth Meeker - given rx Meloxicam 15mg  daily, for foot pain inflammation, has had some improvement.   Perimenopausal Abnormal Menstrual Bleeding Episodes of abnormal menstrual cycle, longest without cycle about 3 months or more, has had phase of more regular cycles recently then more sporadic. She can still have 1-2 days of heavy bleeding, with thinner watery bleeding but that has resolved. No fam history of menopause due to other females in family w hysterectomy   Anxiety Controlled on Citalopram 10mg , she takes half of 20mg , winter or Xmas time she takes more sometimes.  Doing well, needs refill    Health Maintenance:   Colon CA Screening: Never had colonoscopy. Currently asymptomatic. No known family history of colon CA. - Cologuard done 03/25/22, negative, repeat 03/2025   Last pap smear done negative 01/2017 [facility] - she will contact them to schedule, she said last time had menstrual cycle during day of visit so cancelled.      02/25/2023    8:28 AM 04/29/2022    8:34 AM 02/22/2022   12:05 PM  Depression screen PHQ 2/9  Decreased Interest 0 0 0  Down, Depressed, Hopeless 0 0 0  PHQ - 2 Score 0 0 0  Altered sleeping  0 0  Tired, decreased energy  0 0  Change in appetite  0 0  Feeling bad or failure about yourself   0 0  Trouble concentrating  0 0  Moving slowly or fidgety/restless  0 0  Suicidal thoughts  0 0  PHQ-9 Score  0 0  Difficult doing work/chores  Not difficult at all Not difficult at all       02/25/2023    8:29 AM 04/29/2022    8:34 AM 02/19/2021    8:15 AM 11/27/2018   11:17 PM  GAD 7 : Generalized Anxiety Score  Nervous, Anxious, on Edge 0 0 0 0  Control/stop worrying 0 0 0 0  Worry too much - different things 0 0 0 0  Trouble relaxing 0 0 0 0  Restless 0 0 0 0  Easily annoyed or irritable 0 0 0 0  Afraid - awful might happen 0 0 0 0  Total GAD 7 Score 0 0 0 0  Anxiety Difficulty  Not difficult at all  Not difficult at all      Past Medical History:  Diagnosis Date   Panic attacks    Past Surgical History:  Procedure Laterality Date   APPENDECTOMY     Social History   Socioeconomic History   Marital status: Married    Spouse name: Not on file   Number of children: Not on file   Years of education: Not on file   Highest education level: Not on file  Occupational History   Not on file  Tobacco Use   Smoking status: Never   Smokeless tobacco: Never  Vaping Use   Vaping Use: Never used  Substance and Sexual Activity   Alcohol use: No   Drug  use: No   Sexual activity: Yes  Other Topics Concern   Not on file  Social History Narrative   Not on file   Social Determinants of Health   Financial Resource Strain: Not on file  Food Insecurity: Not on file  Transportation Needs: Not on file  Physical Activity: Not on file  Stress: Not on file  Social Connections: Not on file  Intimate Partner Violence: Not on file   Family History  Problem Relation Age of Onset   Congestive Heart Failure Mother    Asthma Mother    Cancer Father        skin, lymphoma   Heart disease Father    Skin cancer Father    Lymphoma Father    Breast cancer Neg Hx    Current Outpatient Medications on File Prior to Visit  Medication Sig   albuterol (VENTOLIN HFA) 108 (90 Base) MCG/ACT inhaler INHALE 2 PUFFS INTO THE LUNGS EVERY 4 HOURS AS NEEDED FOR WHEEZING OR SHORTNESS OF BREATH OR COUGH   No current facility-administered medications on file prior to visit.     Review of Systems  Constitutional:  Negative for activity change, appetite change, chills, diaphoresis, fatigue and fever.  HENT:  Negative for congestion and hearing loss.   Eyes:  Negative for visual disturbance.  Respiratory:  Negative for cough, chest tightness, shortness of breath and wheezing.   Cardiovascular:  Negative for chest pain, palpitations and leg swelling.  Gastrointestinal:  Negative for abdominal pain, constipation, diarrhea, nausea and vomiting.  Genitourinary:  Negative for dysuria, frequency and hematuria.  Musculoskeletal:  Negative for arthralgias and neck pain.  Skin:  Negative for rash.  Neurological:  Negative for dizziness, weakness, light-headedness, numbness and headaches.  Hematological:  Negative for adenopathy.  Psychiatric/Behavioral:  Negative for behavioral problems, dysphoric mood and sleep disturbance.    Per HPI unless specifically indicated above      Objective:    BP 100/60   Pulse 65   Ht 5\' 1"  (1.549 m)   Wt 132 lb (59.9 kg)   LMP 02/21/2023   SpO2 98%   BMI 24.94 kg/m   Wt Readings from Last 3 Encounters:  02/25/23 132 lb (59.9 kg)  10/11/22 130 lb 12.8 oz (59.3 kg)  04/29/22 128 lb (58.1 kg)    Physical Exam Vitals and nursing note reviewed.  Constitutional:      General: She is not in acute distress.    Appearance: She is well-developed. She is not diaphoretic.     Comments: Well-appearing, comfortable, cooperative  HENT:     Head: Normocephalic and atraumatic.     Right Ear: Tympanic membrane, ear canal and external ear normal. There is no impacted cerumen.     Left Ear: Tympanic membrane, ear canal and external ear normal. There is no impacted cerumen.  Eyes:     General:        Right eye: No discharge.        Left eye: No discharge.     Conjunctiva/sclera: Conjunctivae normal.     Pupils: Pupils are equal, round, and reactive to light.  Neck:     Thyroid: No thyromegaly.     Vascular: No carotid bruit.   Cardiovascular:     Rate and Rhythm: Normal rate and regular rhythm.     Pulses: Normal pulses.     Heart sounds: Normal heart sounds. No murmur heard. Pulmonary:     Effort: Pulmonary effort is normal. No respiratory distress.  Breath sounds: Normal breath sounds. No wheezing or rales.  Abdominal:     General: Bowel sounds are normal. There is no distension.     Palpations: Abdomen is soft. There is no mass.     Tenderness: There is no abdominal tenderness.  Musculoskeletal:        General: No tenderness. Normal range of motion.     Cervical back: Normal range of motion and neck supple.     Right lower leg: No edema.     Left lower leg: No edema.     Comments: Upper / Lower Extremities: - Normal muscle tone, strength bilateral upper extremities 5/5, lower extremities 5/5  Lymphadenopathy:     Cervical: No cervical adenopathy.  Skin:    General: Skin is warm and dry.     Findings: No erythema or rash.  Neurological:     Mental Status: She is alert and oriented to person, place, and time.     Comments: Distal sensation intact to light touch all extremities  Psychiatric:        Mood and Affect: Mood normal.        Behavior: Behavior normal.        Thought Content: Thought content normal.     Comments: Well groomed, good eye contact, normal speech and thoughts     I have personally reviewed the radiology report from 11/08/22 on Mammogram.   CLINICAL DATA:  Screening.   EXAM: DIGITAL SCREENING BILATERAL MAMMOGRAM WITH TOMOSYNTHESIS AND CAD   TECHNIQUE: Bilateral screening digital craniocaudal and mediolateral oblique mammograms were obtained. Bilateral screening digital breast tomosynthesis was performed. The images were evaluated with computer-aided detection.   COMPARISON:  Previous exam(s).   ACR Breast Density Category d: The breast tissue is extremely dense, which lowers the sensitivity of mammography   FINDINGS: There are no findings suspicious for  malignancy.   IMPRESSION: No mammographic evidence of malignancy. A result letter of this screening mammogram will be mailed directly to the patient.   RECOMMENDATION: Screening mammogram in one year. (Code:SM-B-01Y)   BI-RADS CATEGORY  1: Negative.     Electronically Signed   By: Edwin Cap M.D.   On: 11/12/2022 10:21   Results for orders placed or performed in visit on 02/25/23  CBC with Differential/Platelet  Result Value Ref Range   WBC 4.1 3.8 - 10.8 Thousand/uL   RBC 4.02 3.80 - 5.10 Million/uL   Hemoglobin 12.4 11.7 - 15.5 g/dL   HCT 16.1 09.6 - 04.5 %   MCV 94.5 80.0 - 100.0 fL   MCH 30.8 27.0 - 33.0 pg   MCHC 32.6 32.0 - 36.0 g/dL   RDW 40.9 81.1 - 91.4 %   Platelets 192 140 - 400 Thousand/uL   MPV 12.2 7.5 - 12.5 fL   Neutro Abs 2,153 1,500 - 7,800 cells/uL   Lymphs Abs 1,488 850 - 3,900 cells/uL   Absolute Monocytes 361 200 - 950 cells/uL   Eosinophils Absolute 78 15 - 500 cells/uL   Basophils Absolute 21 0 - 200 cells/uL   Neutrophils Relative % 52.5 %   Total Lymphocyte 36.3 %   Monocytes Relative 8.8 %   Eosinophils Relative 1.9 %   Basophils Relative 0.5 %      Assessment & Plan:   Problem List Items Addressed This Visit     Anxiety disorder due to general medical condition with panic attack    Stable, chronic anxiety >20 years with infrequent panic attacks, without other complications Not consistent  with GAD, or associated mood disorder. Well functioning. Med history: prior on Klonopin, Wellbutrin Not followed by Psychiatry / Psychology  Plan: 1. Continue Celexa 10mg  daily 2. Continue self management of rare panic attacks - breathing technique, no additional treatment needed 3. Consider psychology / therapy in future if needed      Relevant Medications   citalopram (CELEXA) 10 MG tablet   Elevated LDL cholesterol level    Stable LDL previously The 10-year ASCVD risk score (Arnett DK, et al., 2019) is: 0.5% Encourage lifestyle  management diet management for cholesterol Not indicated for statin  Check fasting lipid      Relevant Orders   TSH   T4, free   COMPLETE METABOLIC PANEL WITH GFR   CBC with Differential/Platelet (Completed)   Lipid panel   Elevated TSH   Relevant Orders   TSH   T4, free   Primary osteoarthritis involving multiple joints    Stable, multiple joint mild arthritic symptoms Followed by Ortho On Meloxicam AS NEEDED  Follow-up PRN      Relevant Medications   meloxicam (MOBIC) 15 MG tablet   Other Visit Diagnoses     Annual physical exam    -  Primary   Relevant Orders   TSH   T4, free   COMPLETE METABOLIC PANEL WITH GFR   CBC with Differential/Platelet (Completed)   Hemoglobin A1c   Lipid panel   Screening for diabetes mellitus (DM)       Relevant Orders   Hemoglobin A1c   Need for hepatitis C screening test       Relevant Orders   Hepatitis C Antibody       Updated Health Maintenance information Fasting lab orders. Pending results. Encouraged improvement to lifestyle with diet and exercise Goal of weight loss  Orders Placed This Encounter  Procedures   Hepatitis C Antibody   TSH   T4, free   COMPLETE METABOLIC PANEL WITH GFR   CBC with Differential/Platelet   Hemoglobin A1c   Lipid panel    Order Specific Question:   Has the patient fasted?    Answer:   Yes      Meds ordered this encounter  Medications   citalopram (CELEXA) 10 MG tablet    Sig: Take 1 tablet (10 mg total) by mouth daily.    Dispense:  90 tablet    Refill:  3    DX Code Needed  F06.4, F41.0   meloxicam (MOBIC) 15 MG tablet    Sig: Take 1 tablet (15 mg total) by mouth daily as needed for pain.    Dispense:  30 tablet    Refill:  2      Follow up plan: Return in about 1 year (around 02/25/2024) for 1 year Annual Physical AM apt fasting lab after.Saralyn Pilar, DO [facility] 02/25/2023, 8:36 AM

## 2023-02-25 NOTE — Assessment & Plan Note (Signed)
Stable, chronic anxiety >20 years with infrequent panic attacks, without other complications Not consistent with GAD, or associated mood disorder. Well functioning. Med history: prior on Klonopin, Wellbutrin Not followed by Psychiatry / Psychology  Plan: 1. Continue Celexa 10mg  daily 2. Continue self management of rare panic attacks - breathing technique, no additional treatment needed 3. Consider psychology / therapy in future if needed

## 2023-02-25 NOTE — Assessment & Plan Note (Signed)
Stable LDL previously The 10-year ASCVD risk score (Arnett DK, et al., 2019) is: 0.5% Encourage lifestyle management diet management for cholesterol Not indicated for statin  Check fasting lipid

## 2023-02-25 NOTE — Assessment & Plan Note (Signed)
Stable, multiple joint mild arthritic symptoms Followed by Ortho On Meloxicam AS NEEDED  Follow-up PRN

## 2023-02-25 NOTE — Patient Instructions (Addendum)
Thank you for coming to the office today.  Med refilled today  Labs today, pending results.   Please schedule a Follow-up Appointment to: Return in about 1 year (around 02/25/2024) for 1 year Annual Physical AM apt fasting lab after..  If you have any other questions or concerns, please feel free to call the office or send a message through MyChart. You may also schedule an earlier appointment if necessary.  Additionally, you may be receiving a survey about your experience at our office within a few days to 1 week by e-mail or mail. We value your feedback.  Saralyn Pilar, DO [facility], [place]

## 2023-02-26 LAB — LIPID PANEL
Cholesterol: 206 mg/dL — ABNORMAL HIGH (ref <200)
HDL: 68 mg/dL (ref >=50)
LDL Cholesterol (Calc): 123 mg/dL (calc) — ABNORMAL HIGH
Non-HDL Cholesterol (Calc): 138 mg/dL (calc) — ABNORMAL HIGH (ref <130)
Total CHOL/HDL Ratio: 3 (calc) (ref <5.0)
Triglycerides: 49 mg/dL (ref <150)

## 2023-02-26 LAB — CBC WITH DIFFERENTIAL/PLATELET
Absolute Monocytes: 361 cells/uL (ref 200–950)
Basophils Absolute: 21 cells/uL (ref 0–200)
Basophils Relative: 0.5 %
Eosinophils Absolute: 78 cells/uL (ref 15–500)
Eosinophils Relative: 1.9 %
HCT: 38 % (ref 35.0–45.0)
Hemoglobin: 12.4 g/dL (ref 11.7–15.5)
Lymphs Abs: 1488 cells/uL (ref 850–3900)
MCH: 30.8 pg (ref 27.0–33.0)
MCHC: 32.6 g/dL (ref 32.0–36.0)
MCV: 94.5 fL (ref 80.0–100.0)
MPV: 12.2 fL (ref 7.5–12.5)
Monocytes Relative: 8.8 %
Neutro Abs: 2153 cells/uL (ref 1500–7800)
Neutrophils Relative %: 52.5 %
Platelets: 192 10*3/uL (ref 140–400)
RBC: 4.02 10*6/uL (ref 3.80–5.10)
RDW: 12.5 % (ref 11.0–15.0)
Total Lymphocyte: 36.3 %
WBC: 4.1 10*3/uL (ref 3.8–10.8)

## 2023-02-26 LAB — COMPLETE METABOLIC PANEL WITH GFR
AG Ratio: 1.9 (calc) (ref 1.0–2.5)
ALT: 11 U/L (ref 6–29)
AST: 15 U/L (ref 10–35)
Albumin: 4.3 g/dL (ref 3.6–5.1)
Alkaline phosphatase (APISO): 41 U/L (ref 31–125)
BUN: 8 mg/dL (ref 7–25)
CO2: 28 mmol/L (ref 20–32)
Calcium: 9.4 mg/dL (ref 8.6–10.2)
Chloride: 105 mmol/L (ref 98–110)
Creat: 0.6 mg/dL (ref 0.50–0.99)
Globulin: 2.3 g/dL (calc) (ref 1.9–3.7)
Glucose, Bld: 81 mg/dL (ref 65–99)
Potassium: 4.5 mmol/L (ref 3.5–5.3)
Sodium: 139 mmol/L (ref 135–146)
Total Bilirubin: 0.6 mg/dL (ref 0.2–1.2)
Total Protein: 6.6 g/dL (ref 6.1–8.1)
eGFR: 111 mL/min/{1.73_m2} (ref >=60)

## 2023-02-26 LAB — HEMOGLOBIN A1C
Hgb A1c MFr Bld: 5.6 % of total Hgb (ref <5.7)
Mean Plasma Glucose: 114 mg/dL
eAG (mmol/L): 6.3 mmol/L

## 2023-02-26 LAB — T4, FREE: Free T4: 0.9 ng/dL (ref 0.8–1.8)

## 2023-02-26 LAB — TSH: TSH: 4.53 mIU/L — ABNORMAL HIGH

## 2023-02-26 LAB — HEPATITIS C ANTIBODY: Hepatitis C Ab: NONREACTIVE

## 2023-02-28 ENCOUNTER — Encounter: Payer: BC Managed Care – PPO | Admitting: Family Medicine

## 2023-03-07 ENCOUNTER — Other Ambulatory Visit: Payer: Self-pay | Admitting: Family Medicine

## 2023-03-07 DIAGNOSIS — F064 Anxiety disorder due to known physiological condition: Secondary | ICD-10-CM

## 2023-03-07 NOTE — Telephone Encounter (Signed)
Unable to refill per protocol, Rx request is too soon. Last refill 02/25/23 for 90 and 3 refills.  Requested Prescriptions  Pending Prescriptions Disp Refills   citalopram (CELEXA) 10 MG tablet [Pharmacy Med Name: CITALOPRAM 10MG  TABLETS] 90 tablet 3    Sig: TAKE 1 TABLET(10 MG) BY MOUTH DAILY     Psychiatry:  Antidepressants - SSRI Passed - 03/07/2023  3:23 AM      Passed - Valid encounter within last 6 months    Recent Outpatient Visits           1 week ago Annual physical exam   [place] [facility] [place], Netta Neat, DO   4 months ago Acute non-recurrent frontal sinusitis   [place] [facility] Smitty Cords, DO   10 months ago Acute non-recurrent frontal sinusitis   [place] [facility] Smitty Cords, DO   1 year ago Annual physical exam   [place] [facility] Smitty Cords, DO   1 year ago Bursitis of right shoulder   [place] [facility] Smitty Cords, DO       Future Appointments             In 11 months Althea Charon, Netta Neat, DO [place] [facility]

## 2023-06-30 ENCOUNTER — Encounter: Payer: Self-pay | Admitting: Internal Medicine

## 2023-06-30 ENCOUNTER — Ambulatory Visit: Payer: BC Managed Care – PPO | Admitting: Internal Medicine

## 2023-06-30 VITALS — BP 118/68 | HR 68 | Temp 96.2°F | Wt 133.0 lb

## 2023-06-30 DIAGNOSIS — J069 Acute upper respiratory infection, unspecified: Secondary | ICD-10-CM

## 2023-06-30 MED ORDER — BENZONATATE 200 MG PO CAPS: 200.0000 mg | ORAL_CAPSULE | Freq: Three times a day (TID) | ORAL | 0 refills | Status: DC | PRN

## 2023-06-30 MED ORDER — PREDNISONE 10 MG PO TABS: ORAL_TABLET | ORAL | 0 refills | Status: DC

## 2023-06-30 NOTE — Progress Notes (Signed)
Subjective:    Patient ID: Sheila Clarke, female    DOB: Feb 08, 1976, 47 y.o.   MRN: [id number]  HPI  Pt presents to the clinic today with c/o runny nose, nasal congestion, ear fullness, scratchy throat, cough and shortness of breath. She reports this started about 1 month ago, seemed to get a little better, than worsened in the last week.  She reports the mucus in her nose has been mostly clear, occasionally yellow.  She denies difficulty swallowing.  The cough is productive of mostly clear mucus.  She denies ear pain, chest pain, nausea, vomiting or diarrhea.  She denies fever, chills or bodyaches.  She did not take a home covid test.  She has tried DayQuil and NyQuil OTC with some relief of symptoms.  She has had sick contacts with similar symptoms.  She has no history of allergies that she is aware of.  She does not smoke.  Review of Systems     Past Medical History:  Diagnosis Date   Panic attacks     Current Outpatient Medications  Medication Sig Dispense Refill   albuterol (VENTOLIN HFA) 108 (90 Base) MCG/ACT inhaler INHALE 2 PUFFS INTO THE LUNGS EVERY 4 HOURS AS NEEDED FOR WHEEZING OR SHORTNESS OF BREATH OR COUGH 6.7 g 3   citalopram (CELEXA) 10 MG tablet Take 1 tablet (10 mg total) by mouth daily. 90 tablet 3   meloxicam (MOBIC) 15 MG tablet Take 1 tablet (15 mg total) by mouth daily as needed for pain. 30 tablet 2   No current facility-administered medications for this visit.    No Known Allergies  Family History  Problem Relation Age of Onset   Congestive Heart Failure Mother    Asthma Mother    Cancer Father        skin, lymphoma   Heart disease Father    Skin cancer Father    Lymphoma Father    Breast cancer Neg Hx     Social History   Socioeconomic History   Marital status: Married    Spouse name: Not on file   Number of children: Not on file   Years of education: Not on file   Highest education level: Not on file  Occupational History   Not on file   Tobacco Use   Smoking status: Never   Smokeless tobacco: Never  Vaping Use   Vaping status: Never Used  Substance and Sexual Activity   Alcohol use: No   Drug use: No   Sexual activity: Yes  Other Topics Concern   Not on file  Social History Narrative   Not on file   Social Determinants of Health   Financial Resource Strain: Not on file  Food Insecurity: Not on file  Transportation Needs: Not on file  Physical Activity: Not on file  Stress: Not on file  Social Connections: Not on file  Intimate Partner Violence: Not on file     Constitutional: Denies fever, malaise, fatigue, headache or abrupt weight changes.  HEENT: Patient reports runny nose, nasal congestion, ear fullness and sore throat.  Denies eye pain, eye redness, ear pain, ringing in the ears, wax buildup, bloody nose. Respiratory: Patient reports cough and shortness of breath.  Denies difficulty breathing.   Cardiovascular: Denies chest pain, chest tightness, palpitations or swelling in the hands or feet.  Gastrointestinal: Denies abdominal pain, bloating, constipation, diarrhea or blood in the stool.   No other specific complaints in a complete review of systems (except as  listed in HPI above).  Objective:   Physical Exam   BP 118/68 (BP Location: Left Arm, Patient Position: Sitting, Cuff Size: Normal)   Pulse 68   Temp (!) 96.2 F (35.7 C) (Temporal)   Wt 133 lb (60.3 kg)   SpO2 95%   BMI 25.13 kg/m   Wt Readings from Last 3 Encounters:  02/25/23 132 lb (59.9 kg)  10/11/22 130 lb 12.8 oz (59.3 kg)  04/29/22 128 lb (58.1 kg)    General: Appears her stated age, well developed, well nourished in NAD. Skin: Warm, dry and intact. No rashes noted. HEENT: Head: normal shape and size, no sinus tenderness noted; Eyes: sclera white, no icterus, conjunctiva pink, PERRLA and EOMs intact; Ears: Tm's gray and intact, normal light reflex; Nose: mucosa pink and moist, septum midline; Throat/Mouth: Teeth present,  mucosa pink and moist, + PND, no exudate, lesions or ulcerations noted.  Neck: Anterior cervical adenopathy noted on the left. Cardiovascular: Normal rate and rhythm. S1,S2 noted.  No murmur, rubs or gallops noted.  Pulmonary/Chest: Normal effort and coarse breath sounds. No respiratory distress. No wheezes, rales or ronchi noted.  Abdomen: Soft and nontender. Normal bowel sounds. No distention or masses noted. Liver, spleen and kidneys non palpable.   BMET    Component Value Date/Time   NA 139 02/25/2023 0908   K 4.5 02/25/2023 0908   CL 105 02/25/2023 0908   CO2 28 02/25/2023 0908   GLUCOSE 81 02/25/2023 0908   BUN 8 02/25/2023 0908   CREATININE 0.60 02/25/2023 0908   CALCIUM 9.4 02/25/2023 0908   GFRNONAA 108 02/19/2021 0844   GFRAA 125 02/19/2021 0844    Lipid Panel     Component Value Date/Time   CHOL 206 (H) 02/25/2023 0908   TRIG 49 02/25/2023 0908   HDL 68 02/25/2023 0908   CHOLHDL 3.0 02/25/2023 0908   LDLCALC 123 (H) 02/25/2023 0908    CBC    Component Value Date/Time   WBC 4.1 02/25/2023 0908   RBC 4.02 02/25/2023 0908   HGB 12.4 02/25/2023 0908   HCT 38.0 02/25/2023 0908   PLT 192 02/25/2023 0908   MCV 94.5 02/25/2023 0908   MCH 30.8 02/25/2023 0908   MCHC 32.6 02/25/2023 0908   RDW 12.5 02/25/2023 0908   LYMPHSABS 1,488 02/25/2023 0908   EOSABS 78 02/25/2023 0908   BASOSABS 21 02/25/2023 0908    Hgb A1C Lab Results  Component Value Date   HGBA1C 5.6 02/25/2023           Assessment & Plan:   Viral URI with cough:  No indication for COVID testing given duration of symptoms Encouraged rest and fluids Rx for Pred taper x 6 days for symptom management Rx for Tessalon 200 mg 3 times daily as needed for cough No indication for antibiotics at this time however if symptoms persist within the week, would send in azithromycin 250 mg x 5 days  Follow-up with your PCP as previously scheduled Nicki Reaper, NP

## 2023-06-30 NOTE — Patient Instructions (Signed)

## 2023-07-04 MED ORDER — AZITHROMYCIN 250 MG PO TABS: ORAL_TABLET | ORAL | 0 refills | Status: DC

## 2023-10-10 ENCOUNTER — Other Ambulatory Visit: Payer: Self-pay | Admitting: Family Medicine

## 2023-10-10 DIAGNOSIS — Z1231 Encounter for screening mammogram for malignant neoplasm of breast: Secondary | ICD-10-CM

## 2023-10-13 ENCOUNTER — Encounter: Payer: Self-pay | Admitting: Family Medicine

## 2023-11-10 ENCOUNTER — Ambulatory Visit: Payer: BC Managed Care – PPO

## 2023-11-10 ENCOUNTER — Ambulatory Visit
Admission: RE | Admit: 2023-11-10 | Discharge: 2023-11-10 | Disposition: A | Discharge status: Home / Self Care | Payer: BC Managed Care – PPO | Source: Ambulatory Visit | Attending: Family Medicine | Admitting: Family Medicine

## 2023-11-10 DIAGNOSIS — Z1231 Encounter for screening mammogram for malignant neoplasm of breast: Secondary | ICD-10-CM | POA: Diagnosis present

## 2023-12-07 ENCOUNTER — Encounter: Payer: Self-pay | Admitting: Family Medicine

## 2023-12-23 ENCOUNTER — Telehealth: Payer: Self-pay

## 2023-12-23 ENCOUNTER — Encounter: Payer: Self-pay | Admitting: Family Medicine

## 2023-12-23 NOTE — Telephone Encounter (Signed)
 Pt called triage needing her Pap results from last year, pt aware she went to [facility] and should reach out to them.

## 2023-12-31 ENCOUNTER — Ambulatory Visit: Admitting: Family Medicine

## 2023-12-31 ENCOUNTER — Encounter: Payer: Self-pay | Admitting: Family Medicine

## 2023-12-31 VITALS — BP 110/68 | HR 81 | Ht 61.0 in | Wt 128.0 lb

## 2023-12-31 DIAGNOSIS — F411 Generalized anxiety disorder: Secondary | ICD-10-CM

## 2023-12-31 DIAGNOSIS — N951 Menopausal and female climacteric states: Secondary | ICD-10-CM | POA: Diagnosis not present

## 2023-12-31 DIAGNOSIS — F410 Panic disorder [episodic paroxysmal anxiety] without agoraphobia: Secondary | ICD-10-CM

## 2023-12-31 DIAGNOSIS — F064 Anxiety disorder due to known physiological condition: Secondary | ICD-10-CM

## 2023-12-31 MED ORDER — CITALOPRAM HYDROBROMIDE 20 MG PO TABS
20.0000 mg | ORAL_TABLET | Freq: Every day | ORAL | 1 refills | Status: DC
Start: 2023-12-31 — End: 2024-07-02

## 2023-12-31 NOTE — Progress Notes (Signed)
 Subjective:    Patient ID: Sheila Clarke, female    DOB: 03-Jun-1976, 48 y.o.   MRN: [id number]  Sheila Clarke is a 48 y.o. female presenting on 12/31/2023 for Anxiety   HPI  Discussed the use of AI scribe software for clinical note transcription with the patient, who gave verbal consent to proceed.  History of Present Illness   Sheila Clarke is a 48 year old female who presents with anxiety related to being a potential liver donor for her sister. She is accompanied by her younger sister.  Chronic history of anxiety / situational vs generalized, on Citalopram 10mg  daily. Now she is experiencing significant anxiety related to the medical procedures and concerns about her sister's outcome as she undergoes evaluation to become a liver donor. Her anxiety is described as a 'roller coaster' and has been exacerbated by recent medical testing, including an MRI, which was particularly stressful due to her claustrophobia. She has more testing scheduled for April 3rd. She is currently taking citalopram 10 mg for anxiety. - support with family, and younger sister, difficulty adjusting to situation with her older sister who is needing the liver transplant  Post Menopausal Vasomotor Symptoms She experiences hot flashes and night sweats, which she attributes to hormonal changes. These symptoms occur nightly and sometimes disrupt her sleep, allowing her only four to five hours of rest before waking up. Hot flashes occur at consistent times, including around 7 PM and 2 AM. She has not taken any medication specifically for these symptoms.  She reports being 'extremely constipated' and has started taking Metamucil to help with bowel movements. She sometimes goes a week without a bowel movement and occasionally uses a stool softener.  Recent test results indicated high cholesterol LDL 145. She is addressing these issues through dietary changes, including consuming baked chicken, fish, beans, and avoiding  greasy foods. She is also taking iron supplements.            12/31/2023    8:36 AM 02/25/2023    8:28 AM 04/29/2022    8:34 AM  Depression screen PHQ 2/9  Decreased Interest 0 0 0  Down, Depressed, Hopeless 0 0 0  PHQ - 2 Score 0 0 0  Altered sleeping 1  0  Tired, decreased energy 1  0  Change in appetite   0  Feeling bad or failure about yourself  0  0  Trouble concentrating 0  0  Moving slowly or fidgety/restless 0  0  Suicidal thoughts 0  0  PHQ-9 Score 2  0  Difficult doing work/chores Not difficult at all  Not difficult at all       12/31/2023    8:36 AM 02/25/2023    8:29 AM 04/29/2022    8:34 AM 02/19/2021    8:15 AM  GAD 7 : Generalized Anxiety Score  Nervous, Anxious, on Edge 1 0 0 0  Control/stop worrying 0 0 0 0  Worry too much - different things 0 0 0 0  Trouble relaxing 0 0 0 0  Restless 0 0 0 0  Easily annoyed or irritable 1 0 0 0  Afraid - awful might happen 0 0 0 0  Total GAD 7 Score 2 0 0 0  Anxiety Difficulty Not difficult at all  Not difficult at all     Social History   Tobacco Use   Smoking status: Never   Smokeless tobacco: Never  Vaping Use   Vaping status: Never Used  Substance Use Topics   Alcohol use: No   Drug use: No    Review of Systems Per HPI unless specifically indicated above     Objective:    BP 110/68 (BP Location: Left Arm, Patient Position: Sitting)   Pulse 81   Ht 5\' 1"  (1.549 m)   Wt 128 lb (58.1 kg)   SpO2 97%   BMI 24.19 kg/m   Wt Readings from Last 3 Encounters:  12/31/23 128 lb (58.1 kg)  06/30/23 133 lb (60.3 kg)  02/25/23 132 lb (59.9 kg)    Physical Exam Vitals and nursing note reviewed.  Constitutional:      General: She is not in acute distress.    Appearance: Normal appearance. She is well-developed. She is not diaphoretic.     Comments: Well-appearing, comfortable, cooperative  HENT:     Head: Normocephalic and atraumatic.  Eyes:     General:        Right eye: No discharge.        Left  eye: No discharge.     Conjunctiva/sclera: Conjunctivae normal.  Cardiovascular:     Rate and Rhythm: Normal rate.  Pulmonary:     Effort: Pulmonary effort is normal.  Skin:    General: Skin is warm and dry.     Findings: No erythema or rash.  Neurological:     Mental Status: She is alert and oriented to person, place, and time.  Psychiatric:        Mood and Affect: Mood normal.        Behavior: Behavior normal.        Thought Content: Thought content normal.     Comments: Well groomed, good eye contact, normal speech and thoughts     Results for orders placed or performed in visit on 02/25/23  Hepatitis C Antibody   Collection Time: 02/25/23  9:08 AM  Result Value Ref Range   Hepatitis C Ab NON-REACTIVE NON-REACTIVE  TSH   Collection Time: 02/25/23  9:08 AM  Result Value Ref Range   TSH 4.53 (H) mIU/L  T4, free   Collection Time: 02/25/23  9:08 AM  Result Value Ref Range   Free T4 0.9 0.8 - 1.8 ng/dL  COMPLETE METABOLIC PANEL WITH GFR   Collection Time: 02/25/23  9:08 AM  Result Value Ref Range   Glucose, Bld 81 65 - 99 mg/dL   BUN 8 7 - 25 mg/dL   Creat 1.61 0.96 - 0.45 mg/dL   eGFR 409 > OR = 60 WJ/XBJ/4.78G9   BUN/Creatinine Ratio SEE NOTE: 6 - 22 (calc)   Sodium 139 135 - 146 mmol/L   Potassium 4.5 3.5 - 5.3 mmol/L   Chloride 105 98 - 110 mmol/L   CO2 28 20 - 32 mmol/L   Calcium 9.4 8.6 - 10.2 mg/dL   Total Protein 6.6 6.1 - 8.1 g/dL   Albumin 4.3 3.6 - 5.1 g/dL   Globulin 2.3 1.9 - 3.7 g/dL (calc)   AG Ratio 1.9 1.0 - 2.5 (calc)   Total Bilirubin 0.6 0.2 - 1.2 mg/dL   Alkaline phosphatase (APISO) 41 31 - 125 U/L   AST 15 10 - 35 U/L   ALT 11 6 - 29 U/L  CBC with Differential/Platelet   Collection Time: 02/25/23  9:08 AM  Result Value Ref Range   WBC 4.1 3.8 - 10.8 Thousand/uL   RBC 4.02 3.80 - 5.10 Million/uL   Hemoglobin 12.4 11.7 - 15.5 g/dL   HCT 56.2 13.0 -  45.0 %   MCV 94.5 80.0 - 100.0 fL   MCH 30.8 27.0 - 33.0 pg   MCHC 32.6 32.0 - 36.0 g/dL    RDW 65.7 84.6 - 96.2 %   Platelets 192 140 - 400 Thousand/uL   MPV 12.2 7.5 - 12.5 fL   Neutro Abs 2,153 1,500 - 7,800 cells/uL   Lymphs Abs 1,488 850 - 3,900 cells/uL   Absolute Monocytes 361 200 - 950 cells/uL   Eosinophils Absolute 78 15 - 500 cells/uL   Basophils Absolute 21 0 - 200 cells/uL   Neutrophils Relative % 52.5 %   Total Lymphocyte 36.3 %   Monocytes Relative 8.8 %   Eosinophils Relative 1.9 %   Basophils Relative 0.5 %  Hemoglobin A1c   Collection Time: 02/25/23  9:08 AM  Result Value Ref Range   Hgb A1c MFr Bld 5.6 <5.7 % of total Hgb   Mean Plasma Glucose 114 mg/dL   eAG (mmol/L) 6.3 mmol/L  Lipid panel   Collection Time: 02/25/23  9:08 AM  Result Value Ref Range   Cholesterol 206 (H) <200 mg/dL   HDL 68 > OR = 50 mg/dL   Triglycerides 49 <952 mg/dL   LDL Cholesterol (Calc) 123 (H) mg/dL (calc)   Total CHOL/HDL Ratio 3.0 <5.0 (calc)   Non-HDL Cholesterol (Calc) 138 (H) <130 mg/dL (calc)      Assessment & Plan:   Problem List Items Addressed This Visit     Anxiety disorder due to general medical condition with panic attack   Relevant Medications   citalopram (CELEXA) 20 MG tablet   Other Visit Diagnoses       GAD (generalized anxiety disorder)    -  Primary   Relevant Medications   citalopram (CELEXA) 20 MG tablet     Menopausal vasomotor syndrome            Anxiety, situational vs Generalized Some chronic history of anxiety, mostly situational now triggered worsened with generalized flare Stressor with upcoming further evaluation for Liver transplant donation to her sister causing major stress Also underlying postmenopausal see below - Increase citalopram dosage from 10 mg to 20 mg daily. (Consider Paxil for evidence on vasomotor symptoms) - Provide a list of counselors and therapists for additional support if needed.  Post Menopausal Symptoms / Vasomotor Menopausal symptoms contributing to anxiety. Non-hormonal options discussed due to  concerns about hormonal treatments before liver donation. - Consider starting OTC Estroven or similar herbal supplements for menopausal symptoms. - Discuss with the transplant team the safety of taking Estroven or gabapentin before the liver donation. - Consider gabapentin for hot flashes if approved by the transplant team. - Less likely options with Clonidine, other SSRI Paxil - Consider GYN for hormonal management in future after Liver transplant if proceeds  Constipation Chronic constipation managed with Metamucil. MiraLAX discussed for stronger relief. - Continue taking Metamucil for fiber supplementation. - Use MiraLAX as needed for constipation management.  Hyperlipidemia Elevated cholesterol with low cardiovascular risk. Lifestyle modifications advised. - Continue dietary modifications to reduce cholesterol intake, including eating baked chicken, baked fish, beans, and avoiding greasy foods. - Monitor cholesterol levels and reassess if necessary.  The 10-year ASCVD risk score (Arnett DK, et al., 2019) is: 0.6%   Values used to calculate the score:     Age: 43 years     Sex: Female     Is Non-Hispanic African American: No     Diabetic: No     Tobacco smoker: No  Systolic Blood Pressure: 110 mmHg     Is BP treated: No     HDL Cholesterol: 78 mg/dL     Total Cholesterol: 230 mg/dL   General Health Maintenance Extensive testing for liver donation ongoing. Chest CT for heart disease screening not needed due to low risk and already had CT Abdomen imaging this year, can defer this further CT to next year or later  Follow-up Scheduled for further testing for liver donation. Physical exam rescheduling considered based on insurance and testing. - Follow up with the transplant team on April 3rd for further testing. - Check with insurance regarding the necessity of a physical exam and reschedule if needed.        No orders of the defined types were placed in this  encounter.   Meds ordered this encounter  Medications   citalopram (CELEXA) 20 MG tablet    Sig: Take 1 tablet (20 mg total) by mouth daily.    Dispense:  90 tablet    Refill:  1    Dose increase from 10 to 20mg . DX Code Needed  F06.4, F41.0    Follow up plan: Return if symptoms worsen or fail to improve.    Saralyn Pilar, DO [facility] 12/31/2023, 8:52 AM

## 2023-12-31 NOTE — Patient Instructions (Addendum)
 Thank you for coming to the office today.  Post menopausal hot flashes / variety of symptoms Recommend Estroven - Black Cohosh  - Ask about Gabapentin for hot flashes, if safe to take before transplant  Dose increase Citalopram 10mg  x 2 = 20mg  new rx sent.  -------------------------------------  These offices have both PSYCHIATRY doctors and Haematologist Available) [facility] [address] [place], [place] [address] Phone: [phone]  [facility] Address: [address] [place], [place] [address] [email or web address] Phone: [phone]  [facility] at [facility]) Address: [address], [place], [place] [address] Hours: 8:30AM-5PM Phone: [phone]  [facility] (Adult, Peds, Geriatric, Counseling) [address] [place], [place] [address] Phone: [phone] Fax: [phone]  [facility] at [facility] [address] [place], [place] [address] Phone: [phone]  [facility] (All ages) [address], [place], [id number] Phone: [phone] (Option 1) [email or web address]  ----------------------------------------------------------------- THERAPIST ONLY  (No Psychiatry)  [facility] [address] [place], [place] [address] Darden Amber P: [phone]  Delight Ovens, LCSW [address] [place], [place] [address] Main Line: [phone]  [facility]) [facility], [facility] [address] [place], [place] [address] [phone]  [facility]   Address: [address] [place], [place] [address] Hours: Open today  9AM-7PM Phone: [phone]  [facility] [address] [facility]  - [facility] Address: [address] [place], [place] [address] Phone: [phone]  [facility] [place], [place] [address] Phone: [phone]    CONSIDER Coronary Calcium Score Cardiac CT Scan. This is a screening test for patients aged 73-50+ with cardiovascular risk factors or who are healthy but would be interested in Cardiovascular Screening for heart disease. Even if there is a family history of heart disease, this imaging can be useful. Typically it can be done every 5+ years or at a different timeline we agree on  The scan will look at the chest and mainly focus on the heart and identify early signs of calcium build up or blockages within the heart arteries. It is not 100% accurate for identifying blockages or heart disease, but it is useful to help [place] predict who may have some early changes or be at risk in the future for a heart attack or cardiovascular problem.  The results are reviewed by a Cardiologist and they will document the results. It should become available on MyChart. Typically the results are divided into percentiles based on other patients of the same demographic and age. So it will compare your risk to others similar to you. If you have a higher score >99 or higher percentile >75%tile, it is recommended to consider Statin cholesterol therapy and or referral to Cardiologist. I will try to help explain your results and if we have questions we can contact the Cardiologist.  You will be contacted for scheduling. Usually it is done at any imaging facility through [facility], [facility] or [facility].  The cost is $99 flat fee total and it does not go through insurance, so no authorization is required.    Please schedule a Follow-up Appointment to: Return if symptoms worsen or fail to improve.  If you have any other questions or concerns, please feel free to call the office or send  a message through MyChart. You may also schedule an earlier appointment if necessary.  Additionally,  you may be receiving a survey about your experience at our office within a few days to 1 week by e-mail or mail. We value your feedback.  Saralyn Pilar, DO [facility], [place]

## 2024-01-12 ENCOUNTER — Encounter: Payer: Self-pay | Admitting: Family Medicine

## 2024-01-14 LAB — HM PAP SMEAR: HM Pap smear: NEGATIVE

## 2024-01-18 ENCOUNTER — Encounter: Payer: Self-pay | Admitting: Family Medicine

## 2024-01-18 DIAGNOSIS — J9801 Acute bronchospasm: Secondary | ICD-10-CM

## 2024-01-18 DIAGNOSIS — J0110 Acute frontal sinusitis, unspecified: Secondary | ICD-10-CM

## 2024-01-20 MED ORDER — ALBUTEROL SULFATE HFA 108 (90 BASE) MCG/ACT IN AERS: 2.0000 | INHALATION_SPRAY | RESPIRATORY_TRACT | 3 refills | Status: AC | PRN

## 2024-01-20 NOTE — Addendum Note (Signed)
 Addended by: Raina Bunting on: 01/20/2024 12:36 PM   Modules accepted: Orders

## 2024-02-05 ENCOUNTER — Encounter: Payer: Self-pay | Admitting: Family Medicine

## 2024-02-05 DIAGNOSIS — J4530 Mild persistent asthma, uncomplicated: Secondary | ICD-10-CM

## 2024-02-06 MED ORDER — FLUTICASONE-SALMETEROL 250-50 MCG/ACT IN AEPB: 1.0000 | INHALATION_SPRAY | Freq: Two times a day (BID) | RESPIRATORY_TRACT | 5 refills | Status: DC

## 2024-02-06 NOTE — Addendum Note (Signed)
 Addended by: Raina Bunting on: 02/06/2024 04:49 PM   Modules accepted: Orders

## 2024-02-26 ENCOUNTER — Encounter: Payer: Self-pay | Admitting: Family Medicine

## 2024-03-02 ENCOUNTER — Encounter: Payer: Self-pay | Admitting: Family Medicine

## 2024-03-02 NOTE — Telephone Encounter (Signed)
 Original message copied to patients Sheila Clarke) chart.

## 2024-03-11 ENCOUNTER — Encounter: Payer: Self-pay | Admitting: Family Medicine

## 2024-03-18 ENCOUNTER — Encounter: Payer: Self-pay | Admitting: Family Medicine

## 2024-03-25 DIAGNOSIS — Z0279 Encounter for issue of other medical certificate: Secondary | ICD-10-CM

## 2024-04-01 ENCOUNTER — Ambulatory Visit: Payer: Self-pay

## 2024-04-01 NOTE — Telephone Encounter (Signed)
Will discuss at upcoming appointment tomorrow

## 2024-04-01 NOTE — Telephone Encounter (Signed)
 FYI Only or Action Required?: FYI only for provider.  Patient was last seen in primary care on 12/31/2023 by Edman Marsa PARAS, DO. Called Nurse Triage reporting Urinary Tract Infection. Symptoms began about a month ago. Interventions attempted: Nothing. Symptoms are: gradually worsening.  Triage Disposition: See Physician Within 24 Hours  Patient/caregiver understands and will follow disposition?: Yes, will follow disposition  Copied from CRM [id number]. Topic: Clinical - Red Word Triage >> Apr 01, 2024 11:38 AM Elle L wrote: Red Word that prompted transfer to Nurse Triage: The patient called regarding doing a urine sample as she believes she may have a UTI. She is having piercing pain in her lower right side. Reason for Disposition  Bad or foul-smelling urine  Answer Assessment - Initial Assessment Questions 1. SYMPTOM: What's the main symptom you're concerned about? (e.g., frequency, incontinence)     Odorous, cloudy, lower abd pain 2. ONSET:        A month or 2 months, abd pain just started recently 3. PAIN: Is there any pain? If Yes, ask: How bad is it? (Scale: 1-10; mild, moderate, severe)     7, intermittent 4. CAUSE: What do you think is causing the symptoms?     UTI 5. OTHER SYMPTOMS: Do you have any other symptoms? (e.g., blood in urine, fever, flank pain, pain with urination)     denies 6. PREGNANCY: Is there any chance you are pregnant? When was your last menstrual period?     menopause  Protocols used: Urinary Symptoms-A-AH

## 2024-04-02 ENCOUNTER — Ambulatory Visit: Admitting: Internal Medicine

## 2024-04-05 ENCOUNTER — Ambulatory Visit: Admitting: Internal Medicine

## 2024-04-19 NOTE — Telephone Encounter (Signed)
 error

## 2024-07-01 ENCOUNTER — Other Ambulatory Visit: Payer: Self-pay | Admitting: Family Medicine

## 2024-07-01 DIAGNOSIS — F064 Anxiety disorder due to known physiological condition: Secondary | ICD-10-CM

## 2024-07-02 NOTE — Telephone Encounter (Signed)
 Courtesy refill. Patient will need an office visit for additional refills.  Requested Prescriptions  Pending Prescriptions Disp Refills   citalopram  (CELEXA ) 20 MG tablet [Pharmacy Med Name: CITALOPRAM  20MG  TABLETS] 90 tablet 1    Sig: TAKE 1 TABLET(20 MG) BY MOUTH DAILY     Psychiatry:  Antidepressants - SSRI Failed - 07/02/2024 11:49 AM      Failed - Valid encounter within last 6 months    Recent Outpatient Visits           6 months ago GAD (generalized anxiety disorder)   [facility], Marsa PARAS, [place]

## 2024-07-27 ENCOUNTER — Ambulatory Visit: Admitting: Family Medicine

## 2024-08-07 ENCOUNTER — Other Ambulatory Visit: Payer: Self-pay | Admitting: Family Medicine

## 2024-08-07 DIAGNOSIS — F064 Anxiety disorder due to known physiological condition: Secondary | ICD-10-CM

## 2024-08-09 NOTE — Telephone Encounter (Signed)
 Requested medication (s) are due for refill today: yes  Requested medication (s) are on the active medication list: yes  Last refill:  07/02/24  Future visit scheduled: yes  Notes to clinic:  Unable to refill per protocol, courtesy refill already given, routing for provider approval.      Requested Prescriptions  Pending Prescriptions Disp Refills   citalopram  (CELEXA ) 20 MG tablet [Pharmacy Med Name: CITALOPRAM  20MG  TABLETS] 30 tablet 0    Sig: TAKE 1 TABLET(20 MG) BY MOUTH DAILY     Psychiatry:  Antidepressants - SSRI Failed - 08/09/2024  3:52 PM      Failed - Valid encounter within last 6 months    Recent Outpatient Visits           7 months ago GAD (generalized anxiety disorder)   [facility], Marsa PARAS, DO

## 2024-09-07 ENCOUNTER — Ambulatory Visit (INDEPENDENT_AMBULATORY_CARE_PROVIDER_SITE_OTHER): Admitting: Family Medicine

## 2024-09-07 VITALS — BP 122/78 | HR 71 | Ht 61.0 in | Wt 130.5 lb

## 2024-09-07 DIAGNOSIS — E7800 Pure hypercholesterolemia, unspecified: Secondary | ICD-10-CM | POA: Diagnosis not present

## 2024-09-07 DIAGNOSIS — F411 Generalized anxiety disorder: Secondary | ICD-10-CM | POA: Diagnosis not present

## 2024-09-07 DIAGNOSIS — M150 Primary generalized (osteo)arthritis: Secondary | ICD-10-CM

## 2024-09-07 DIAGNOSIS — Z0000 Encounter for general adult medical examination without abnormal findings: Secondary | ICD-10-CM

## 2024-09-07 DIAGNOSIS — F410 Panic disorder [episodic paroxysmal anxiety] without agoraphobia: Secondary | ICD-10-CM

## 2024-09-07 DIAGNOSIS — Z1231 Encounter for screening mammogram for malignant neoplasm of breast: Secondary | ICD-10-CM

## 2024-09-07 DIAGNOSIS — F064 Anxiety disorder due to known physiological condition: Secondary | ICD-10-CM

## 2024-09-07 DIAGNOSIS — R7989 Other specified abnormal findings of blood chemistry: Secondary | ICD-10-CM

## 2024-09-07 DIAGNOSIS — Z23 Encounter for immunization: Secondary | ICD-10-CM | POA: Diagnosis not present

## 2024-09-07 MED ORDER — CITALOPRAM HYDROBROMIDE 20 MG PO TABS: 20.0000 mg | ORAL_TABLET | Freq: Every day | ORAL | 3 refills | Status: AC

## 2024-09-07 MED ORDER — MELOXICAM 15 MG PO TABS: 15.0000 mg | ORAL_TABLET | Freq: Every day | ORAL | 3 refills | Status: AC | PRN

## 2024-09-07 NOTE — Progress Notes (Signed)
 Subjective:    Patient ID: Sheila Clarke, female    DOB: 01-19-1976, 48 y.o.   MRN: [id number]  Sheila Clarke is a 48 y.o. female presenting on 09/07/2024 for Annual Exam   HPI  Discussed the use of AI scribe software for clinical note transcription with the patient, who gave verbal consent to proceed.  History of Present Illness   Sheila Clarke is a 48 year old female who presents for an annual physical exam and routine blood work.  Preventive health maintenance - Presenting for annual physical examination and routine blood work - Extensive blood work completed in April and June 2025 for potential liver donation evaluation - Interested in rechecking cholesterol levels - Cologuard test completed in June 2023, valid until June 2026 - Mammogram completed in February 2025, plans to schedule next mammogram - Tetanus vaccination in 2021, due again in 2026  Immunization status - Hepatitis B vaccine administered in April 2025 after non-reactive result in March 2025 - Seroconversion to immunity confirmed by June 2025 testing  Perimenopausal Menstrual irregularity and perimenopausal symptoms - Amenorrhea since July 2025, with similar pattern the previous year - Menstrual cycle, when resumed, characterized as 'really bad'  Musculoskeletal pain and arthritis History of Arthritis, Joint Pain, Dupuytren's Contracture Followed by Orthopedic Dr Cleotilde - given rx Meloxicam  15mg  daily, for foot pain inflammation, has had some improvement. - Meloxicam  used as needed for arthritis and joint pain - Recent use of TENS machine for muscle pain with reported benefit  Symptoms of restless legs - Restless legs at night managed with compression socks  Constipation - Constipation managed with Miralax as needed, typically added to coffee - Currently adjusting dosing to optimize symptom control    Anxiety Controlled on Citalopram  20mg  daily  HYPERLIPIDEMIA: - Reports no concerns. Last  lipid panel 2024, LDL 120s Not on statin therapy     Health Maintenance:   Colon CA Screening: Never had colonoscopy. Currently asymptomatic. No known family history of colon CA. - Cologuard done 03/25/22, negative, repeat 03/2025   Last pap smear done negative 01/14/24 [facility]   Mammogram last done 11/2023, next 11/2024     09/07/2024    8:53 AM 12/31/2023    8:36 AM 02/25/2023    8:28 AM  Depression screen PHQ 2/9  Decreased Interest 0 0 0  Down, Depressed, Hopeless 0 0 0  PHQ - 2 Score 0 0 0  Altered sleeping  1   Tired, decreased energy  1   Feeling bad or failure about yourself   0   Trouble concentrating  0   Moving slowly or fidgety/restless  0   Suicidal thoughts  0   PHQ-9 Score  2    Difficult doing work/chores  Not difficult at all      Data saved with a previous flowsheet row definition       09/07/2024    8:53 AM 12/31/2023    8:36 AM 02/25/2023    8:29 AM 04/29/2022    8:34 AM  GAD 7 : Generalized Anxiety Score  Nervous, Anxious, on Edge 0 1 0 0  Control/stop worrying 0 0 0 0  Worry too much - different things 0 0 0 0  Trouble relaxing 0 0 0 0  Restless 0 0 0 0  Easily annoyed or irritable 0 1 0 0  Afraid - awful might happen 0 0 0 0  Total GAD 7 Score 0 2 0 0  Anxiety Difficulty  Not  difficult at all  Not difficult at all     Past Medical History:  Diagnosis Date   Panic attacks    Past Surgical History:  Procedure Laterality Date   APPENDECTOMY     Social History   Socioeconomic History   Marital status: Married    Spouse name: Not on file   Number of children: Not on file   Years of education: Not on file   Highest education level: Some college, no degree  Occupational History   Not on file  Tobacco Use   Smoking status: Never   Smokeless tobacco: Never  Vaping Use   Vaping status: Never Used  Substance and Sexual Activity   Alcohol use: No   Drug use: No   Sexual activity: Yes  Other Topics Concern   Not on file  Social History  Narrative   Not on file   Social Drivers of Health   Financial Resource Strain: Low Risk  (09/07/2024)   Overall Financial Resource Strain (CARDIA)    Difficulty of Paying Living Expenses: Not hard at all  Food Insecurity: No Food Insecurity (09/07/2024)   Hunger Vital Sign    Worried About Running Out of Food in the Last Year: Never true    Ran Out of Food in the Last Year: Never true  Transportation Needs: No Transportation Needs (09/07/2024)   PRAPARE - Administrator, Civil Service (Medical): No    Lack of Transportation (Non-Medical): No  Physical Activity: Inactive (09/07/2024)   Exercise Vital Sign    Days of Exercise per Week: 0 days    Minutes of Exercise per Session: Not on file  Stress: No Stress Concern Present (09/07/2024)   [employer] of Occupational Health - Occupational Stress Questionnaire    Feeling of Stress: Only a little  Social Connections: Socially Integrated (09/07/2024)   Social Connection and Isolation Panel    Frequency of Communication with Friends and Family: More than three times a week    Frequency of Social Gatherings with Friends and Family: Twice a week    Attends Religious Services: More than 4 times per year    Active Member of [employer] or Organizations: Yes    Attends Engineer, Structural: More than 4 times per year    Marital Status: Married  Catering Manager Violence: Not on file   Family History  Problem Relation Age of Onset   Congestive Heart Failure Mother    Asthma Mother    Cancer Father        skin, lymphoma   Heart disease Father    Skin cancer Father    Lymphoma Father    Breast cancer Neg Hx    Current Outpatient Medications on File Prior to Visit  Medication Sig   albuterol  (VENTOLIN  HFA) 108 (90 Base) MCG/ACT inhaler Inhale 2 puffs into the lungs every 4 (four) hours as needed for wheezing or shortness of breath.   Ascorbic Acid (VITAMIN C) 1000 MG tablet Take 1,000 mg by mouth daily.   No current  facility-administered medications on file prior to visit.    Review of Systems  Constitutional:  Negative for activity change, appetite change, chills, diaphoresis, fatigue and fever.  HENT:  Negative for congestion and hearing loss.   Eyes:  Negative for visual disturbance.  Respiratory:  Negative for cough, chest tightness, shortness of breath and wheezing.   Cardiovascular:  Negative for chest pain, palpitations and leg swelling.  Gastrointestinal:  Negative for abdominal pain,  constipation, diarrhea, nausea and vomiting.  Genitourinary:  Negative for dysuria, frequency and hematuria.  Musculoskeletal:  Negative for arthralgias and neck pain.  Skin:  Negative for rash.  Neurological:  Negative for dizziness, weakness, light-headedness, numbness and headaches.  Hematological:  Negative for adenopathy.  Psychiatric/Behavioral:  Negative for behavioral problems, dysphoric mood and sleep disturbance.    Per HPI unless specifically indicated above     Objective:    BP 122/78 (BP Location: Left Arm, Patient Position: Sitting, Cuff Size: Normal)   Pulse 71   Ht 5' 1 (1.549 m)   Wt 130 lb 8 oz (59.2 kg)   SpO2 98%   BMI 24.66 kg/m   Wt Readings from Last 3 Encounters:  09/07/24 130 lb 8 oz (59.2 kg)  12/31/23 128 lb (58.1 kg)  06/30/23 133 lb (60.3 kg)    Physical Exam Vitals and nursing note reviewed.  Constitutional:      General: She is not in acute distress.    Appearance: She is well-developed. She is not diaphoretic.     Comments: Well-appearing, comfortable, cooperative  HENT:     Head: Normocephalic and atraumatic.  Eyes:     General:        Right eye: No discharge.        Left eye: No discharge.     Conjunctiva/sclera: Conjunctivae normal.     Pupils: Pupils are equal, round, and reactive to light.  Neck:     Thyroid: No thyromegaly.     Vascular: No carotid bruit.  Cardiovascular:     Rate and Rhythm: Normal rate and regular rhythm.     Pulses: Normal  pulses.     Heart sounds: Normal heart sounds. No murmur heard. Pulmonary:     Effort: Pulmonary effort is normal. No respiratory distress.     Breath sounds: Normal breath sounds. No wheezing or rales.  Abdominal:     General: Bowel sounds are normal. There is no distension.     Palpations: Abdomen is soft. There is no mass.     Tenderness: There is no abdominal tenderness.  Musculoskeletal:        General: No tenderness. Normal range of motion.     Cervical back: Normal range of motion and neck supple.     Right lower leg: No edema.     Left lower leg: No edema.     Comments: Upper / Lower Extremities: - Normal muscle tone, strength bilateral upper extremities 5/5, lower extremities 5/5  Lymphadenopathy:     Cervical: No cervical adenopathy.  Skin:    General: Skin is warm and dry.     Findings: No erythema or rash.  Neurological:     Mental Status: She is alert and oriented to person, place, and time.     Comments: Distal sensation intact to light touch all extremities  Psychiatric:        Mood and Affect: Mood normal.        Behavior: Behavior normal.        Thought Content: Thought content normal.     Comments: Well groomed, good eye contact, normal speech and thoughts     Results for orders placed or performed in visit on 09/07/24  HM PAP SMEAR   Collection Time: 01/14/24 12:00 AM  Result Value Ref Range   HM Pap smear Negative cytology, negative high risk HPV       Assessment & Plan:   Problem List Items Addressed This Visit  Anxiety disorder due to general medical condition with panic attack   Relevant Medications   citalopram  (CELEXA ) 20 MG tablet   Elevated LDL cholesterol level   Relevant Orders   Lipid panel   Comprehensive metabolic panel with GFR   Elevated TSH   Relevant Orders   TSH   T4, free   Primary osteoarthritis involving multiple joints   Relevant Medications   meloxicam  (MOBIC ) 15 MG tablet   Other Relevant Orders   CBC with  Differential/Platelet   Comprehensive metabolic panel with GFR   Other Visit Diagnoses       Annual physical exam    -  Primary   Relevant Orders   TSH   Lipid panel   Hemoglobin A1c   CBC with Differential/Platelet   Comprehensive metabolic panel with GFR   T4, free     Flu vaccine need       Relevant Orders   Flu vaccine trivalent PF, 6mos and older(Flulaval,Afluria,Fluarix,Fluzone) (Completed)     GAD (generalized anxiety disorder)       Relevant Medications   citalopram  (CELEXA ) 20 MG tablet     Encounter for screening mammogram for malignant neoplasm of breast       Relevant Orders   MM 3D SCREENING MAMMOGRAM BILATERAL BREAST        Updated Health Maintenance information Reviewed recent lab results with patient Encouraged improvement to lifestyle with diet and exercise Goal of weight loss   Routine wellness visit with vaccination and screening review.  Past history of lab titer Hepatitis B immune after first dose of Heplisav vaccine through [facility] earlier this year after initial lab was non reactive. Mammogram due February 2026.  Cologuard up to date.  Tetanus due 2026.  Prefers to wait on pneumonia, shingles vaccines, and heart CT scan. - Ordered blood work: cholesterol, sugar, blood counts, chemistry, kidney, liver, TSH, T4. - Ordered mammogram for February 2026. - Recommended future Prevnar 20 pneumonia vaccine. - Recommended future Shingrix shingles vaccine.  Primary generalized osteoarthritis Chronic joint pain managed with meloxicam . Increased pain in winter. TENS machine used for muscle pain relief. - Prescribed meloxicam  30-day supply as needed. - Encouraged TENS machine use for muscle pain relief.  Generalized anxiety disorder Managed with Celexa  20 mg daily. - Prescribed Celexa  20 mg daily for 90 days.  Constipation Chronic constipation managed with Miralax. Flexible dosing discussed. - Continue Miralax as needed, adjust dosage based on  response.  Restless legs syndrome Mild symptoms, possibly related to anxiety or iron deficiency. Compression socks provide relief. Discussed natural supplements. - Consider magnesium or iron supplements. Vs Hyland OTC option - Continue compression socks for relief.  Abnormal thyroid function tests Elevated TSH previously Monitoring TSH and T4 levels. Discussed potential future treatment. - Continue monitoring TSH and T4 levels.  Hypercholesterolemia Previous labs LDL 120 range Due for fasting lipid panel today - Ordered blood work to assess cholesterol levels.         Orders Placed This Encounter  Procedures   MM 3D SCREENING MAMMOGRAM BILATERAL BREAST    Standing Status:   Future    Expiration Date:   09/07/2025    Reason for Exam (SYMPTOM  OR DIAGNOSIS REQUIRED):   Screening bilateral 3D Mammogram Tomo    Preferred imaging location?:   [facility]   Flu vaccine trivalent PF, 6mos and older(Flulaval,Afluria,Fluarix,Fluzone)   HM PAP SMEAR    This external order was created through the Results Console.   TSH   Lipid panel  Has the patient fasted?:   Yes   Hemoglobin A1c   CBC with Differential/Platelet   Comprehensive metabolic panel with GFR    Has the patient fasted?:   Yes   T4, free    Meds ordered this encounter  Medications   citalopram  (CELEXA ) 20 MG tablet    Sig: Take 1 tablet (20 mg total) by mouth daily.    Dispense:  90 tablet    Refill:  3   meloxicam  (MOBIC ) 15 MG tablet    Sig: Take 1 tablet (15 mg total) by mouth daily as needed for pain.    Dispense:  30 tablet    Refill:  3     Follow up plan: Return for 1 year Annual Physical AM fasting lab AFTER.  Marsa Officer, DO [facility] 09/07/2024, 9:15 AM

## 2024-09-07 NOTE — Patient Instructions (Addendum)
 Thank you for coming to the office today.  Consider Hyland's Restful Leg treatments, herbal / supplement OTC  FYI - Recommend Prevnar-20 pneumonia vaccine in future, here or at the pharmacy 1 dose last for 5 years - Recommend Shingrix Shingles vaccine at the pharmacy, check cost, 2 doses, 2-6 month apart, can make you feel sick.  For Mammogram screening for breast cancer   Call the Imaging Center below anytime to schedule your own appointment now that order has been placed.  [facility] [address] [place], [place] [address] Phone: [phone]   -------------  Future Coronary Calcium Score Cardiac CT Scan. This is a screening test for patients aged 29-50+ with cardiovascular risk factors or who are healthy but would be interested in Cardiovascular Screening for heart disease. Even if there is a family history of heart disease, this imaging can be useful. Typically it can be done every 5+ years or at a different timeline we agree on  The scan will look at the chest and mainly focus on the heart and identify early signs of calcium build up or blockages within the heart arteries. It is not 100% accurate for identifying blockages or heart disease, but it is useful to help us  predict who may have some early changes or be at risk in the future for a heart attack or cardiovascular problem.  The results are reviewed by a Cardiologist and they will document the results. It should become available on MyChart. Typically the results are divided into percentiles based on other patients of the same demographic and age. So it will compare your risk to others similar to you. If you have a higher score >99 or higher percentile >75%tile, it is recommended to consider Statin cholesterol therapy and or referral to Cardiologist. I will try to help explain your results and if we have questions we can contact the  Cardiologist.  You will be contacted for scheduling. Usually it is done at any imaging facility through [facility], [facility] or [facility].  The cost is $99 flat fee total and it does not go through insurance, so no authorization is required.  ------------------------------------------------  DUE for FASTING BLOOD WORK (no food or drink after midnight before the lab appointment, only water or coffee without cream/sugar on the morning of)  SCHEDULE Lab Only visit in the morning at the clinic for lab draw in 1 YEAR  - Make sure Lab Only appointment is at about 1 week before your next appointment, so that results will be available  For Lab Results, once available within 2-3 days of blood draw, you can can log in to MyChart online to view your results and a brief explanation. Also, we can discuss results at next follow-up visit.   Please schedule a Follow-up Appointment to: Return for 1 year Annual Physical AM fasting lab AFTER.  If you have any other questions or concerns, please feel free to call the office or send a message through MyChart. You may also schedule an earlier appointment if necessary.  Additionally, you may be receiving a survey about your experience at our office within a few days to 1 week by e-mail or mail. We value your feedback.  Marsa Officer, DO [facility], [place]

## 2024-09-08 ENCOUNTER — Ambulatory Visit: Payer: Self-pay | Admitting: Family Medicine

## 2024-09-08 LAB — COMPREHENSIVE METABOLIC PANEL WITH GFR
AG Ratio: 1.8 (calc) (ref 1.0–2.5)
ALT: 9 U/L (ref 6–29)
AST: 15 U/L (ref 10–35)
Albumin: 4.7 g/dL (ref 3.6–5.1)
Alkaline phosphatase (APISO): 41 U/L (ref 37–153)
BUN: 11 mg/dL (ref 7–25)
CO2: 28 mmol/L (ref 20–32)
Calcium: 9.9 mg/dL (ref 8.6–10.4)
Chloride: 102 mmol/L (ref 98–110)
Creat: 0.61 mg/dL (ref 0.50–1.03)
Globulin: 2.6 g/dL (ref 1.9–3.7)
Glucose, Bld: 84 mg/dL (ref 65–99)
Potassium: 4.4 mmol/L (ref 3.5–5.3)
Sodium: 137 mmol/L (ref 135–146)
Total Bilirubin: 0.5 mg/dL (ref 0.2–1.2)
Total Protein: 7.3 g/dL (ref 6.1–8.1)
eGFR: 109 mL/min/1.73m2 (ref >=60)

## 2024-09-08 LAB — CBC WITH DIFFERENTIAL/PLATELET
Absolute Lymphocytes: 1568 {cells}/uL (ref 850–3900)
Absolute Monocytes: 424 {cells}/uL (ref 200–950)
Basophils Absolute: 32 {cells}/uL (ref 0–200)
Basophils Relative: 0.8 %
Eosinophils Absolute: 80 {cells}/uL (ref 15–500)
Eosinophils Relative: 2 %
HCT: 38.8 % (ref 35.9–46.0)
Hemoglobin: 13 g/dL (ref 11.7–15.5)
MCH: 31.5 pg (ref 27.0–33.0)
MCHC: 33.5 g/dL (ref 31.6–35.4)
MCV: 93.9 fL (ref 81.4–101.7)
MPV: 12.4 fL (ref 7.5–12.5)
Monocytes Relative: 10.6 %
Neutro Abs: 1896 {cells}/uL (ref 1500–7800)
Neutrophils Relative %: 47.4 %
Platelets: 208 Thousand/uL (ref 140–400)
RBC: 4.13 Million/uL (ref 3.80–5.10)
RDW: 12.2 % (ref 11.0–15.0)
Total Lymphocyte: 39.2 %
WBC: 4 Thousand/uL (ref 3.8–10.8)

## 2024-09-08 LAB — LIPID PANEL
Cholesterol: 233 mg/dL — ABNORMAL HIGH (ref <200)
HDL: 72 mg/dL (ref >=50)
LDL Cholesterol (Calc): 146 mg/dL — ABNORMAL HIGH
Non-HDL Cholesterol (Calc): 161 mg/dL — ABNORMAL HIGH (ref <130)
Total CHOL/HDL Ratio: 3.2 (calc) (ref <5.0)
Triglycerides: 54 mg/dL (ref <150)

## 2024-09-08 LAB — HEMOGLOBIN A1C
Hgb A1c MFr Bld: 5.5 % (ref <5.7)
Mean Plasma Glucose: 111 mg/dL
eAG (mmol/L): 6.2 mmol/L

## 2024-09-08 LAB — TSH: TSH: 4.81 m[IU]/L — ABNORMAL HIGH

## 2024-09-08 LAB — T4, FREE: Free T4: 1 ng/dL (ref 0.8–1.8)

## 2024-11-17 ENCOUNTER — Ambulatory Visit

## 2025-09-08 ENCOUNTER — Encounter: Admitting: Family Medicine
